# Patient Record
Sex: Male | Born: 1960 | Race: Black or African American | Hispanic: No | Marital: Married | Smoking: Never smoker
Health system: Southern US, Community
[De-identification: ages and names within clinical notes are randomized; demographics above are authoritative.]

## PROBLEM LIST (undated history)

## (undated) DIAGNOSIS — N400 Enlarged prostate without lower urinary tract symptoms: Secondary | ICD-10-CM

## (undated) DIAGNOSIS — M1990 Unspecified osteoarthritis, unspecified site: Secondary | ICD-10-CM

## (undated) DIAGNOSIS — F329 Major depressive disorder, single episode, unspecified: Secondary | ICD-10-CM

## (undated) DIAGNOSIS — F32A Depression, unspecified: Secondary | ICD-10-CM

## (undated) DIAGNOSIS — N289 Disorder of kidney and ureter, unspecified: Secondary | ICD-10-CM

## (undated) DIAGNOSIS — E785 Hyperlipidemia, unspecified: Secondary | ICD-10-CM

## (undated) HISTORY — DX: Depression, unspecified: F32.A

## (undated) HISTORY — DX: Major depressive disorder, single episode, unspecified: F32.9

## (undated) HISTORY — DX: Hyperlipidemia, unspecified: E78.5

## (undated) HISTORY — DX: Benign prostatic hyperplasia without lower urinary tract symptoms: N40.0

## (undated) HISTORY — DX: Unspecified osteoarthritis, unspecified site: M19.90

---

## 1986-10-02 HISTORY — PX: APPENDECTOMY: SHX54

## 2006-10-02 HISTORY — PX: ANKLE SURGERY: SHX546

## 2015-03-03 LAB — COLOGUARD: COLOGUARD: NEGATIVE

## 2015-10-07 ENCOUNTER — Telehealth: Payer: Self-pay

## 2015-10-07 NOTE — Telephone Encounter (Signed)
Pre Visit call completed. 

## 2015-10-08 ENCOUNTER — Encounter: Payer: Self-pay | Admitting: Physician Assistant

## 2015-10-08 ENCOUNTER — Ambulatory Visit (INDEPENDENT_AMBULATORY_CARE_PROVIDER_SITE_OTHER): Payer: BLUE CROSS/BLUE SHIELD | Admitting: Physician Assistant

## 2015-10-08 VITALS — BP 148/88 | HR 62 | Temp 98.0°F | Ht 69.0 in | Wt 225.6 lb

## 2015-10-08 DIAGNOSIS — M67911 Unspecified disorder of synovium and tendon, right shoulder: Secondary | ICD-10-CM | POA: Diagnosis not present

## 2015-10-08 DIAGNOSIS — G8929 Other chronic pain: Secondary | ICD-10-CM | POA: Diagnosis not present

## 2015-10-08 DIAGNOSIS — M25569 Pain in unspecified knee: Secondary | ICD-10-CM

## 2015-10-08 DIAGNOSIS — M25562 Pain in left knee: Secondary | ICD-10-CM

## 2015-10-08 DIAGNOSIS — M67919 Unspecified disorder of synovium and tendon, unspecified shoulder: Secondary | ICD-10-CM | POA: Insufficient documentation

## 2015-10-08 MED ORDER — DICLOFENAC SODIUM 75 MG PO TBEC: 75.0000 mg | DELAYED_RELEASE_TABLET | Freq: Two times a day (BID) | ORAL | Status: DC

## 2015-10-08 NOTE — Progress Notes (Signed)
Pre visit review using our clinic review tool, if applicable. No additional management support is needed unless otherwise documented below in the visit note. 

## 2015-10-08 NOTE — Progress Notes (Signed)
Patient presents to clinic today to establish care.  Acute Concerns: Left knee pain and swelling x 6 months or so. Denies trauma or injury. Does have history of arthritis from playing sports throughout adolesent and young adulthood. Has been followed by Orthopedics ([facility]). Has had multiple plain films of the knee and has had joint aspiration and injections in the knee without improvement in symptoms. Pain is averaging 7/10 and a 10/10 at its worst. Is worse with prolonged ambulation. Has had a few instances where the knee has buckled. Denies falls.   Patient also endorses chronic pain in R shoulder s/p multiple joint injections without relief in pain. Is followed by Ortho but unhappy with results as pain is still just as severe. Denies ever having MRI. Endorses good ROM but significant pain with ROM. Denies numbness or tingling.  Chronic Issues: BPH -- Currently on Flomax 0.4 mg nightly with good relief of symptoms. Last PSA 2 weeks ago with previous PCP. Will obtain records.  Past Medical History  Diagnosis Date  . Enlarged prostate   . Hyperlipidemia   . Osteoarthritis   . Depression     Past Surgical History  Procedure Laterality Date  . Appendectomy  1988  . Ankle surgery  2008    No current outpatient prescriptions on file prior to visit.   No current facility-administered medications on file prior to visit.    Allergies  Allergen Reactions  . Statins Other (See Comments)    Muscle pain    Family History  Problem Relation Age of Onset  . Ovarian cancer Mother   . Hypertension Mother   . Heart attack Father [id number]  . Heart attack Brother 38  . Healthy Child     x 2    Social History   Social History  . Marital Status: Married    Spouse Name: N/A  . Number of Children: N/A  . Years of Education: N/A   Occupational History  . Not on file.   Social History Main Topics  . Smoking status: Never Smoker   . Smokeless tobacco: Never Used  . Alcohol Use:  No  . Drug Use: No  . Sexual Activity:    Partners: Female   Other Topics Concern  . Not on file   Social History Narrative   Review of Systems  Constitutional: Negative for fever and malaise/fatigue.  Cardiovascular: Negative for chest pain and palpitations.  Musculoskeletal: Positive for joint pain. Negative for falls.  Neurological: Negative for dizziness, tingling, sensory change and headaches.   BP 148/88 mmHg  Pulse 62  Temp(Src) 98 F (36.7 C) (Oral)  Ht 5\' 9"  (1.753 m)  Wt 225 lb 9.6 oz (102.331 kg)  BMI 33.30 kg/m2  SpO2 98%  Physical Exam  Constitutional: He is oriented to person, place, and time and well-developed, well-nourished, and in no distress.  HENT:  Head: Normocephalic and atraumatic.  Cardiovascular: Normal rate, regular rhythm, normal heart sounds and intact distal pulses.   Pulmonary/Chest: Effort normal and breath sounds normal. No respiratory distress. He has no wheezes. He has no rales. He exhibits no tenderness.  Musculoskeletal:       Right shoulder: He exhibits pain and decreased strength. He exhibits normal range of motion.       Left knee: He exhibits abnormal meniscus. Tenderness found. Medial joint line tenderness noted.  Neurological: He is alert and oriented to person, place, and time.  Skin: Skin is warm and dry.  Vitals reviewed.  No results found for this or any previous visit (from the past 2160 hour(s)).  Assessment/Plan: Chronic knee pain Previously followed by Ortho with injections and aspirations. Giving buckling and abnl meniscus on exam, need MRI. Order placed. Will begin bracing, RICE and Voltaren BID. Follow-up 3-4 weeks.  Tendinopathy of rotator cuff Rx Diclofenac for pain. Referral to Sports Medicine for assessment and [place].

## 2015-10-08 NOTE — Patient Instructions (Signed)
Please start the Diclofenac as directed with food. Apply topical Aspercreme to the knee and shoulder. Wear a knee sleeve daily and elevate leg while resting. Apply ice.   You will be contacted to schedule MRI of the knee and for assessment of your shoulder with Sports Medicine.  Follow-up with me in 1 month.

## 2015-10-08 NOTE — Assessment & Plan Note (Signed)
Rx Diclofenac for pain. Referral to Sports Medicine for assessment and [place].

## 2015-10-08 NOTE — Assessment & Plan Note (Signed)
Previously followed by Ortho with injections and aspirations. Giving buckling and abnl meniscus on exam, need MRI. Order placed. Will begin bracing, RICE and Voltaren BID. Follow-up 3-4 weeks.

## 2015-10-13 ENCOUNTER — Ambulatory Visit: Payer: BLUE CROSS/BLUE SHIELD | Admitting: Family Medicine

## 2015-10-16 ENCOUNTER — Ambulatory Visit (HOSPITAL_BASED_OUTPATIENT_CLINIC_OR_DEPARTMENT_OTHER)
Admission: RE | Admit: 2015-10-16 | Discharge: 2015-10-16 | Disposition: A | Discharge status: Home / Self Care | Payer: BLUE CROSS/BLUE SHIELD | Source: Ambulatory Visit | Attending: Physician Assistant | Admitting: Physician Assistant

## 2015-10-16 DIAGNOSIS — M25562 Pain in left knee: Secondary | ICD-10-CM | POA: Diagnosis present

## 2015-10-16 DIAGNOSIS — R609 Edema, unspecified: Secondary | ICD-10-CM | POA: Insufficient documentation

## 2015-10-16 DIAGNOSIS — G8929 Other chronic pain: Secondary | ICD-10-CM

## 2015-10-16 DIAGNOSIS — X58XXXA Exposure to other specified factors, initial encounter: Secondary | ICD-10-CM | POA: Insufficient documentation

## 2015-10-16 DIAGNOSIS — S83242A Other tear of medial meniscus, current injury, left knee, initial encounter: Secondary | ICD-10-CM | POA: Diagnosis not present

## 2015-10-18 ENCOUNTER — Telehealth: Payer: Self-pay | Admitting: *Deleted

## 2015-10-18 DIAGNOSIS — S83207A Unspecified tear of unspecified meniscus, current injury, left knee, initial encounter: Secondary | ICD-10-CM

## 2015-10-18 NOTE — Telephone Encounter (Signed)
Referral placed. He will be contacted to schedule. Would he be willing to let me write some work restrictions for him to help cut back on walking and heavy lifting?

## 2015-10-18 NOTE — Telephone Encounter (Signed)
Spoke with the pt and informed him of recent MRI results and note.  Pt verbalized understanding.  Pt stated that he would like to be set up with another specialist.  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

## 2015-10-18 NOTE — Telephone Encounter (Signed)
-----   Message from Waldon MerlWilliam C Martin, PA-C sent at 10/17/2015  5:31 PM EST ----- MRI reveals meniscal tear -- We need to get him back in with Orthopedics as this needs surgical correction. This is why his symptoms have been so chronic. Again I do not understand why his previous Orthopedist had not ordered MRI to further assess. He can follow-up with them or let me set him up with a new specialist. Elevate leg while resting. Continue knee bracing and pain medication as directed. What are his job responsibilities -- we need to limit the amount of walking and heavy lifting he does.

## 2015-10-19 NOTE — Telephone Encounter (Signed)
Called and [facility] @ 11:04am @ [phone]) asking the pt to RTC regarding note below.//AB/CMA

## 2015-10-20 ENCOUNTER — Encounter: Payer: Self-pay | Admitting: Physician Assistant

## 2015-10-20 NOTE — Telephone Encounter (Signed)
Spoke with the pt and informed him of the note below.  Pt verbalized understanding.  Pt agreed to the letter for work restrictions. Informed Selena Batten that the pt agreed to the letter for work,so a letter was written and given to the pt.//AB/CMA

## 2015-11-08 ENCOUNTER — Ambulatory Visit: Payer: BLUE CROSS/BLUE SHIELD | Admitting: Physician Assistant

## 2016-01-25 ENCOUNTER — Ambulatory Visit (INDEPENDENT_AMBULATORY_CARE_PROVIDER_SITE_OTHER): Payer: BLUE CROSS/BLUE SHIELD | Admitting: Physician Assistant

## 2016-01-25 ENCOUNTER — Encounter: Payer: Self-pay | Admitting: Physician Assistant

## 2016-01-25 VITALS — BP 138/80 | HR 69 | Temp 97.5°F | Ht 69.0 in | Wt 227.6 lb

## 2016-01-25 DIAGNOSIS — Z136 Encounter for screening for cardiovascular disorders: Secondary | ICD-10-CM | POA: Diagnosis not present

## 2016-01-25 DIAGNOSIS — Z125 Encounter for screening for malignant neoplasm of prostate: Secondary | ICD-10-CM

## 2016-01-25 DIAGNOSIS — Z0000 Encounter for general adult medical examination without abnormal findings: Secondary | ICD-10-CM | POA: Diagnosis not present

## 2016-01-25 DIAGNOSIS — Z8249 Family history of ischemic heart disease and other diseases of the circulatory system: Secondary | ICD-10-CM

## 2016-01-25 DIAGNOSIS — G8929 Other chronic pain: Secondary | ICD-10-CM

## 2016-01-25 DIAGNOSIS — Z0001 Encounter for general adult medical examination with abnormal findings: Secondary | ICD-10-CM | POA: Diagnosis not present

## 2016-01-25 DIAGNOSIS — M2560 Stiffness of unspecified joint, not elsewhere classified: Secondary | ICD-10-CM

## 2016-01-25 DIAGNOSIS — M25562 Pain in left knee: Secondary | ICD-10-CM

## 2016-01-25 LAB — CBC
HCT: 36.4 % — ABNORMAL LOW (ref 39.0–52.0)
HEMOGLOBIN: 11.8 g/dL — AB (ref 13.0–17.0)
MCHC: 32.5 g/dL (ref 30.0–36.0)
MCV: 82.2 fl (ref 78.0–100.0)
PLATELETS: 219 10*3/uL (ref 150.0–400.0)
RBC: 4.43 Mil/uL (ref 4.22–5.81)
RDW: 15.5 % (ref 11.5–15.5)
WBC: 4.5 10*3/uL (ref 4.0–10.5)

## 2016-01-25 LAB — HEMOGLOBIN A1C: Hgb A1c MFr Bld: 6.1 % (ref 4.6–6.5)

## 2016-01-25 LAB — COMPREHENSIVE METABOLIC PANEL
ALK PHOS: 73 U/L (ref 39–117)
ALT: 51 U/L (ref 0–53)
AST: 25 U/L (ref 0–37)
Albumin: 4.1 g/dL (ref 3.5–5.2)
BILIRUBIN TOTAL: 0.5 mg/dL (ref 0.2–1.2)
BUN: 14 mg/dL (ref 6–23)
CALCIUM: 9.2 mg/dL (ref 8.4–10.5)
CO2: 29 meq/L (ref 19–32)
Chloride: 106 mEq/L (ref 96–112)
Creatinine, Ser: 0.95 mg/dL (ref 0.40–1.50)
GFR: 105.6 mL/min (ref >60.00)
Glucose, Bld: 97 mg/dL (ref 70–99)
POTASSIUM: 4.6 meq/L (ref 3.5–5.1)
Sodium: 140 mEq/L (ref 135–145)
Total Protein: 6.8 g/dL (ref 6.0–8.3)

## 2016-01-25 LAB — LIPID PANEL
CHOLESTEROL: 231 mg/dL — AB (ref 0–200)
HDL: 41.5 mg/dL (ref >39.00)
LDL CALC: 173 mg/dL — AB (ref 0–99)
NonHDL: 189.95
TRIGLYCERIDES: 87 mg/dL (ref 0.0–149.0)
Total CHOL/HDL Ratio: 6
VLDL: 17.4 mg/dL (ref 0.0–40.0)

## 2016-01-25 LAB — PSA: PSA: 0.5 ng/mL (ref 0.10–4.00)

## 2016-01-25 LAB — RHEUMATOID FACTOR: Rhuematoid fact SerPl-aCnc: <10 IU/mL (ref <=14)

## 2016-01-25 LAB — SEDIMENTATION RATE: SED RATE: 13 mm/h (ref 0–22)

## 2016-01-25 LAB — TSH: TSH: 0.78 u[IU]/mL (ref 0.35–4.50)

## 2016-01-25 MED ORDER — DICLOFENAC SODIUM 75 MG PO TBEC: 75.0000 mg | DELAYED_RELEASE_TABLET | Freq: Two times a day (BID) | ORAL | Status: DC

## 2016-01-25 NOTE — Progress Notes (Signed)
Pre visit review using our clinic review tool, if applicable. No additional management support is needed unless otherwise documented below in the visit note. 

## 2016-01-25 NOTE — Patient Instructions (Signed)
Please go to the lab for blood work.   Our office will call you with your results unless you have chosen to receive results via MyChart.  If your blood work is normal we will follow-up each year for physicals and as scheduled for chronic medical problems.  If anything is abnormal we will treat accordingly and get you in for a follow-up.  Please take the Voltaren as directed with food. If your labs show signs of rheumatoid arthritis, I will be setting you up with a Rheumatologist for management.  Follow-up will be based on results.  Preventive Care for Adults, Male A healthy lifestyle and preventive care can promote health and wellness. Preventive health guidelines for men include the following key practices:  A routine yearly physical is a good way to check with your health care provider about your health and preventative screening. It is a chance to share any concerns and updates on your health and to receive a thorough exam.  Visit your dentist for a routine exam and preventative care every 6 months. Brush your teeth twice a day and floss once a day. Good oral hygiene prevents tooth decay and gum disease.  The frequency of eye exams is based on your age, health, family medical history, use of contact lenses, and other factors. Follow your health care provider's recommendations for frequency of eye exams.  Eat a healthy diet. Foods such as vegetables, fruits, whole grains, low-fat dairy products, and lean protein foods contain the nutrients you need without too many calories. Decrease your intake of foods high in solid fats, added sugars, and salt. Eat the right amount of calories for you.Get information about a proper diet from your health care provider, if necessary.  Regular physical exercise is one of the most important things you can do for your health. Most adults should get at least 150 minutes of moderate-intensity exercise (any activity that increases your heart rate and causes you  to sweat) each week. In addition, most adults need muscle-strengthening exercises on 2 or more days a week.  Maintain a healthy weight. The body mass index (BMI) is a screening tool to identify possible weight problems. It provides an estimate of body fat based on height and weight. Your health care provider can find your BMI and can help you achieve or maintain a healthy weight.For adults 20 years and older:  A BMI below 18.5 is considered underweight.  A BMI of 18.5 to 24.9 is normal.  A BMI of 25 to 29.9 is considered overweight.  A BMI of 30 and above is considered obese.  Maintain normal blood lipids and cholesterol levels by exercising and minimizing your intake of saturated fat. Eat a balanced diet with plenty of fruit and vegetables. Blood tests for lipids and cholesterol should begin at age 92 and be repeated every 5 years. If your lipid or cholesterol levels are high, you are over 50, or you are at high risk for heart disease, you may need your cholesterol levels checked more frequently.Ongoing high lipid and cholesterol levels should be treated with medicines if diet and exercise are not working.  If you smoke, find out from your health care provider how to quit. If you do not use tobacco, do not start.  Lung cancer screening is recommended for adults aged 76-80 years who are at high risk for developing lung cancer because of a history of smoking. A yearly low-dose CT scan of the lungs is recommended for people who have at least a  30-pack-year history of smoking and are a current smoker or have quit within the past 15 years. A pack year of smoking is smoking an average of 1 pack of cigarettes a day for 1 year (for example: 1 pack a day for 30 years or 2 packs a day for 15 years). Yearly screening should continue until the smoker has stopped smoking for at least 15 years. Yearly screening should be stopped for people who develop a health problem that would prevent them from having lung  cancer treatment.  If you choose to drink alcohol, do not have more than 2 drinks per day. One drink is considered to be 12 ounces (355 mL) of beer, 5 ounces (148 mL) of wine, or 1.5 ounces (44 mL) of liquor.  Avoid use of street drugs. Do not share needles with anyone. Ask for help if you need support or instructions about stopping the use of drugs.  High blood pressure causes heart disease and increases the risk of stroke. Your blood pressure should be checked at least every 1-2 years. Ongoing high blood pressure should be treated with medicines, if weight loss and exercise are not effective.  If you are 73-43 years old, ask your health care provider if you should take aspirin to prevent heart disease.  Diabetes screening is done by taking a blood sample to check your blood glucose level after you have not eaten for a certain period of time (fasting). If you are not overweight and you do not have risk factors for diabetes, you should be screened once every 3 years starting at age 20. If you are overweight or obese and you are 28-63 years of age, you should be screened for diabetes every year as part of your cardiovascular risk assessment.  Colorectal cancer can be detected and often prevented. Most routine colorectal cancer screening begins at the age of 76 and continues through age 53. However, your health care provider may recommend screening at an earlier age if you have risk factors for colon cancer. On a yearly basis, your health care provider may provide home test kits to check for hidden blood in the stool. Use of a small camera at the end of a tube to directly examine the colon (sigmoidoscopy or colonoscopy) can detect the earliest forms of colorectal cancer. Talk to your health care provider about this at age 17, when routine screening begins. Direct exam of the colon should be repeated every 5-10 years through age 24, unless early forms of precancerous polyps or small growths are  found.  People who are at an increased risk for hepatitis B should be screened for this virus. You are considered at high risk for hepatitis B if:  You were born in a country where hepatitis B occurs often. Talk with your health care provider about which countries are considered high risk.  Your parents were born in a high-risk country and you have not received a shot to protect against hepatitis B (hepatitis B vaccine).  You have HIV or AIDS.  You use needles to inject street drugs.  You live with, or have sex with, someone who has hepatitis B.  You are a man who has sex with other men (MSM).  You get hemodialysis treatment.  You take certain medicines for conditions such as cancer, organ transplantation, and autoimmune conditions.  Hepatitis C blood testing is recommended for all people born from [birth year] through 1965 and any individual with known risks for hepatitis C.  Practice safe sex. Use  condoms and avoid high-risk sexual practices to reduce the spread of sexually transmitted infections (STIs). STIs include gonorrhea, chlamydia, syphilis, trichomonas, herpes, HPV, and human immunodeficiency virus (HIV). Herpes, HIV, and HPV are viral illnesses that have no cure. They can result in disability, cancer, and death.  If you are a man who has sex with other men, you should be screened at least once per year for:  HIV.  Urethral, rectal, and pharyngeal infection of gonorrhea, chlamydia, or both.  If you are at risk of being infected with HIV, it is recommended that you take a prescription medicine daily to prevent HIV infection. This is called preexposure prophylaxis (PrEP). You are considered at risk if:  You are a man who has sex with other men (MSM) and have other risk factors.  You are a heterosexual man, are sexually active, and are at increased risk for HIV infection.  You take drugs by injection.  You are sexually active with a partner who has HIV.  Talk with your health  care provider about whether you are at high risk of being infected with HIV. If you choose to begin PrEP, you should first be tested for HIV. You should then be tested every 3 months for as long as you are taking PrEP.  A one-time screening for abdominal aortic aneurysm (AAA) and surgical repair of large AAAs by ultrasound are recommended for men ages 38 to 34 years who are current or former smokers.  Healthy men should no longer receive prostate-specific antigen (PSA) blood tests as part of routine cancer screening. Talk with your health care provider about prostate cancer screening.  Testicular cancer screening is not recommended for adult males who have no symptoms. Screening includes self-exam, a health care provider exam, and other screening tests. Consult with your health care provider about any symptoms you have or any concerns you have about testicular cancer.  Use sunscreen. Apply sunscreen liberally and repeatedly throughout the day. You should seek shade when your shadow is shorter than you. Protect yourself by wearing long sleeves, pants, a wide-brimmed hat, and sunglasses year round, whenever you are outdoors.  Once a month, do a whole-body skin exam, using a mirror to look at the skin on your back. Tell your health care provider about new moles, moles that have irregular borders, moles that are larger than a pencil eraser, or moles that have changed in shape or color.  Stay current with required vaccines (immunizations).  Influenza vaccine. All adults should be immunized every year.  Tetanus, diphtheria, and acellular pertussis (Td, Tdap) vaccine. An adult who has not previously received Tdap or who does not know his vaccine status should receive 1 dose of Tdap. This initial dose should be followed by tetanus and diphtheria toxoids (Td) booster doses every 10 years. Adults with an unknown or incomplete history of completing a 3-dose immunization series with Td-containing vaccines should  begin or complete a primary immunization series including a Tdap dose. Adults should receive a Td booster every 10 years.  Varicella vaccine. An adult without evidence of immunity to varicella should receive 2 doses or a second dose if he has previously received 1 dose.  Human papillomavirus (HPV) vaccine. Males aged 11-21 years who have not received the vaccine previously should receive the 3-dose series. Males aged 22-26 years may be immunized. Immunization is recommended through the age of 75 years for any male who has sex with males and did not get any or all doses earlier. Immunization is recommended  for any person with an immunocompromised condition through the age of 29 years if he did not get any or all doses earlier. During the 3-dose series, the second dose should be obtained 4-8 weeks after the first dose. The third dose should be obtained 24 weeks after the first dose and 16 weeks after the second dose.  Zoster vaccine. One dose is recommended for adults aged 53 years or older unless certain conditions are present.  Measles, mumps, and rubella (MMR) vaccine. Adults born before [birth year] generally are considered immune to measles and mumps. Adults born in [birth year] or later should have 1 or more doses of MMR vaccine unless there is a contraindication to the vaccine or there is laboratory evidence of immunity to each of the three diseases. A routine second dose of MMR vaccine should be obtained at least 28 days after the first dose for students attending postsecondary schools, health care workers, or international travelers. People who received inactivated measles vaccine or an unknown type of measles vaccine during 1963-1967 should receive 2 doses of MMR vaccine. People who received inactivated mumps vaccine or an unknown type of mumps vaccine before 1979 and are at high risk for mumps infection should consider immunization with 2 doses of MMR vaccine. Unvaccinated health care workers born before [birth year] who  lack laboratory evidence of measles, mumps, or rubella immunity or laboratory confirmation of disease should consider measles and mumps immunization with 2 doses of MMR vaccine or rubella immunization with 1 dose of MMR vaccine.  Pneumococcal 13-valent conjugate (PCV13) vaccine. When indicated, a person who is uncertain of his immunization history and has no record of immunization should receive the PCV13 vaccine. All adults 70 years of age and older should receive this vaccine. An adult aged 38 years or older who has certain medical conditions and has not been previously immunized should receive 1 dose of PCV13 vaccine. This PCV13 should be followed with a dose of pneumococcal polysaccharide (PPSV23) vaccine. Adults who are at high risk for pneumococcal disease should obtain the PPSV23 vaccine at least 8 weeks after the dose of PCV13 vaccine. Adults older than 56 years of age who have normal immune system function should obtain the PPSV23 vaccine dose at least 1 year after the dose of PCV13 vaccine.  Pneumococcal polysaccharide (PPSV23) vaccine. When PCV13 is also indicated, PCV13 should be obtained first. All adults aged 59 years and older should be immunized. An adult younger than age 76 years who has certain medical conditions should be immunized. Any person who resides in a nursing home or long-term care facility should be immunized. An adult smoker should be immunized. People with an immunocompromised condition and certain other conditions should receive both PCV13 and PPSV23 vaccines. People with human immunodeficiency virus (HIV) infection should be immunized as soon as possible after diagnosis. Immunization during chemotherapy or radiation therapy should be avoided. Routine use of PPSV23 vaccine is not recommended for American Indians, [place] Natives, or people younger than 65 years unless there are medical conditions that require PPSV23 vaccine. When indicated, people who have unknown immunization and  have no record of immunization should receive PPSV23 vaccine. One-time revaccination 5 years after the first dose of PPSV23 is recommended for people aged 19-64 years who have chronic kidney failure, nephrotic syndrome, asplenia, or immunocompromised conditions. People who received 1-2 doses of PPSV23 before age 37 years should receive another dose of PPSV23 vaccine at age 58 years or later if at least 5 years have passed since the previous  dose. Doses of PPSV23 are not needed for people immunized with PPSV23 at or after age 19 years.  Meningococcal vaccine. Adults with asplenia or persistent complement component deficiencies should receive 2 doses of quadrivalent meningococcal conjugate (MenACWY-D) vaccine. The doses should be obtained at least 2 months apart. Microbiologists working with certain meningococcal bacteria, [employer], people at risk during an outbreak, and people who travel to or live in countries with a high rate of meningitis should be immunized. A first-year college student up through age 47 years who is living in a residence hall should receive a dose if he did not receive a dose on or after his 16th birthday. Adults who have certain high-risk conditions should receive one or more doses of vaccine.  Hepatitis A vaccine. Adults who wish to be protected from this disease, have chronic liver disease, work with hepatitis A-infected animals, work in hepatitis A research labs, or travel to or work in countries with a high rate of hepatitis A should be immunized. Adults who were previously unvaccinated and who anticipate close contact with an international adoptee during the first 60 days after arrival in the [place] from a country with a high rate of hepatitis A should be immunized.  Hepatitis B vaccine. Adults should be immunized if they wish to be protected from this disease, are under age 45 years and have diabetes, have chronic liver disease, have had more than one sex partner in  the past 6 months, may be exposed to blood or other infectious body fluids, are household contacts or sex partners of hepatitis B positive people, are clients or workers in certain care facilities, or travel to or work in countries with a high rate of hepatitis B.  Haemophilus influenzae type b (Hib) vaccine. A previously unvaccinated person with asplenia or sickle cell disease or having a scheduled splenectomy should receive 1 dose of Hib vaccine. Regardless of previous immunization, a recipient of a hematopoietic stem cell transplant should receive a 3-dose series 6-12 months after his successful transplant. Hib vaccine is not recommended for adults with HIV infection. Preventive Service / Frequency Ages 11 to 13  Blood pressure check.** / Every 3-5 years.  Lipid and cholesterol check.** / Every 5 years beginning at age 30.  Hepatitis C blood test.** / For any individual with known risks for hepatitis C.  Skin self-exam. / Monthly.  Influenza vaccine. / Every year.  Tetanus, diphtheria, and acellular pertussis (Tdap, Td) vaccine.** / Consult your health care provider. 1 dose of Td every 10 years.  Varicella vaccine.** / Consult your health care provider.  HPV vaccine. / 3 doses over 6 months, if 52 or younger.  Measles, mumps, rubella (MMR) vaccine.** / You need at least 1 dose of MMR if you were born in 1957 or later. You may also need a second dose.  Pneumococcal 13-valent conjugate (PCV13) vaccine.** / Consult your health care provider.  Pneumococcal polysaccharide (PPSV23) vaccine.** / 1 to 2 doses if you smoke cigarettes or if you have certain conditions.  Meningococcal vaccine.** / 1 dose if you are age 82 to 99 years and a Market researcher living in a residence hall, or have one of several medical conditions. You may also need additional booster doses.  Hepatitis A vaccine.** / Consult your health care provider.  Hepatitis B vaccine.** / Consult your health care  provider.  Haemophilus influenzae type b (Hib) vaccine.** / Consult your health care provider. Ages 90 to 40  Blood pressure check.** /  Every year.  Lipid and cholesterol check.** / Every 5 years beginning at age 15.  Lung cancer screening. / Every year if you are aged 75-80 years and have a 30-pack-year history of smoking and currently smoke or have quit within the past 15 years. Yearly screening is stopped once you have quit smoking for at least 15 years or develop a health problem that would prevent you from having lung cancer treatment.  Fecal occult blood test (FOBT) of stool. / Every year beginning at age 74 and continuing until age 27. You may not have to do this test if you get a colonoscopy every 10 years.  Flexible sigmoidoscopy** or colonoscopy.** / Every 5 years for a flexible sigmoidoscopy or every 10 years for a colonoscopy beginning at age 75 and continuing until age 1.  Hepatitis C blood test.** / For all people born from [birth year] through 1965 and any individual with known risks for hepatitis C.  Skin self-exam. / Monthly.  Influenza vaccine. / Every year.  Tetanus, diphtheria, and acellular pertussis (Tdap/Td) vaccine.** / Consult your health care provider. 1 dose of Td every 10 years.  Varicella vaccine.** / Consult your health care provider.  Zoster vaccine.** / 1 dose for adults aged 45 years or older.  Measles, mumps, rubella (MMR) vaccine.** / You need at least 1 dose of MMR if you were born in 1957 or later. You may also need a second dose.  Pneumococcal 13-valent conjugate (PCV13) vaccine.** / Consult your health care provider.  Pneumococcal polysaccharide (PPSV23) vaccine.** / 1 to 2 doses if you smoke cigarettes or if you have certain conditions.  Meningococcal vaccine.** / Consult your health care provider.  Hepatitis A vaccine.** / Consult your health care provider.  Hepatitis B vaccine.** / Consult your health care provider.  Haemophilus influenzae  type b (Hib) vaccine.** / Consult your health care provider. Ages 38 and over  Blood pressure check.** / Every year.  Lipid and cholesterol check.**/ Every 5 years beginning at age 20.  Lung cancer screening. / Every year if you are aged 77-80 years and have a 30-pack-year history of smoking and currently smoke or have quit within the past 15 years. Yearly screening is stopped once you have quit smoking for at least 15 years or develop a health problem that would prevent you from having lung cancer treatment.  Fecal occult blood test (FOBT) of stool. / Every year beginning at age 2 and continuing until age 68. You may not have to do this test if you get a colonoscopy every 10 years.  Flexible sigmoidoscopy** or colonoscopy.** / Every 5 years for a flexible sigmoidoscopy or every 10 years for a colonoscopy beginning at age 100 and continuing until age 87.  Hepatitis C blood test.** / For all people born from [birth year] through 1965 and any individual with known risks for hepatitis C.  Abdominal aortic aneurysm (AAA) screening.** / A one-time screening for ages 68 to 60 years who are current or former smokers.  Skin self-exam. / Monthly.  Influenza vaccine. / Every year.  Tetanus, diphtheria, and acellular pertussis (Tdap/Td) vaccine.** / 1 dose of Td every 10 years.  Varicella vaccine.** / Consult your health care provider.  Zoster vaccine.** / 1 dose for adults aged 56 years or older.  Pneumococcal 13-valent conjugate (PCV13) vaccine.** / 1 dose for all adults aged 8 years and older.  Pneumococcal polysaccharide (PPSV23) vaccine.** / 1 dose for all adults aged 51 years and older.  Meningococcal vaccine.** / Consult  your health care provider.  Hepatitis A vaccine.** / Consult your health care provider.  Hepatitis B vaccine.** / Consult your health care provider.  Haemophilus influenzae type b (Hib) vaccine.** / Consult your health care provider. **Family history and personal history  of risk and conditions may change your health care provider's recommendations.   This information is not intended to replace advice given to you by your health care provider. Make sure you discuss any questions you have with your health care provider.   Document Released: 11/14/2001 Document Revised: 10/09/2014 Document Reviewed: 02/13/2011 [employer]vier Interactive Patient Education [employer]. .

## 2016-01-25 NOTE — Progress Notes (Signed)
Patient presents to clinic today for annual exam.  Patient is fasting for labs.  Acute Concerns: Patient endorses stiffness and aching pain in fingers of hands bilaterally. Endorses taking > 1-2 hours to fully work out the stiffness. Denies redness or swelling of joints. Denies trauma or injury of hands.  Chronic Issues: BPH -- Is currently on Tamsulosin, prescribed by Urology. Endorses doing very well on this regimen.   Body mass index is 33.6 kg/(m^2). -- Is working on exercise and diet. Exercise is limited at present due to a meniscal tear. Patient has declined interventions recommended by Orthopedics.   Health Maintenance: Immunizations --Declines Tetanus today. Colonoscopy -- Cologuard up-to-date  Past Medical History  Diagnosis Date  . Enlarged prostate   . Hyperlipidemia   . Osteoarthritis   . Depression     Past Surgical History  Procedure Laterality Date  . Appendectomy  1988  . Ankle surgery  2008    Current Outpatient Prescriptions on File Prior to Visit  Medication Sig Dispense Refill  . Omega-3 Fatty Acids (FISH OIL) 1000 MG CAPS Take 1 capsule by mouth daily.    . tamsulosin (FLOMAX) 0.4 MG CAPS capsule Take 1 capsule by mouth daily.     No current facility-administered medications on file prior to visit.    Allergies  Allergen Reactions  . Statins Other (See Comments)    Muscle pain    Family History  Problem Relation Age of Onset  . Ovarian cancer Mother   . Hypertension Mother   . Heart attack Father 25  . Heart attack Brother 28  . Healthy Child     x 2    Social History   Social History  . Marital Status: Married    Spouse Name: N/A  . Number of Children: N/A  . Years of Education: N/A   Occupational History  . Not on file.   Social History Main Topics  . Smoking status: Never Smoker   . Smokeless tobacco: Never Used  . Alcohol Use: No  . Drug Use: No  . Sexual Activity:    Partners: Female   Other Topics Concern  . Not  on file   Social History Narrative   Review of Systems  Constitutional: Negative for fever and weight loss.  HENT: Negative for ear discharge, ear pain, hearing loss and tinnitus.   Eyes: Negative for blurred vision, double vision, photophobia and pain.  Respiratory: Negative for cough and shortness of breath.   Cardiovascular: Negative for chest pain and palpitations.  Gastrointestinal: Negative for heartburn, nausea, vomiting, abdominal pain, diarrhea, constipation, blood in stool and melena.  Genitourinary: Negative for dysuria, urgency, frequency, hematuria and flank pain.  Musculoskeletal: Positive for joint pain. Negative for falls.  Neurological: Negative for dizziness, loss of consciousness and headaches.  Endo/Heme/Allergies: Negative for environmental allergies.  Psychiatric/Behavioral: Negative for depression, suicidal ideas, hallucinations and substance abuse. The patient is not nervous/anxious and does not have insomnia.    BP 138/80 mmHg  Pulse 69  Temp(Src) 97.5 F (36.4 C) (Oral)  Ht '5\' 9"'$  (1.753 m)  Wt 227 lb 9.6 oz (103.239 kg)  BMI 33.60 kg/m2  SpO2 98%  Physical Exam  Constitutional: He is oriented to person, place, and time and well-developed, well-nourished, and in no distress.  HENT:  Head: Normocephalic and atraumatic.  Right Ear: External ear normal.  Left Ear: External ear normal.  Nose: Nose normal.  Mouth/Throat: Oropharynx is clear and moist. No oropharyngeal exudate.  Eyes: Conjunctivae  and EOM are normal. Pupils are equal, round, and reactive to light.  Neck: Neck supple. No thyromegaly present.  Cardiovascular: Normal rate, regular rhythm, normal heart sounds and intact distal pulses.   Pulmonary/Chest: Effort normal and breath sounds normal. No respiratory distress. He has no wheezes. He has no rales. He exhibits no tenderness.  Abdominal: Soft. Bowel sounds are normal. He exhibits no distension and no mass. There is no tenderness. There is no  rebound and no guarding.  Genitourinary: Testes/scrotum normal.  Musculoskeletal:       Right hand: He exhibits normal range of motion and no swelling. Normal sensation noted. Normal strength noted.       Left hand: He exhibits normal range of motion and no swelling. Normal sensation noted. Normal strength noted.  Multiple bouchard's nodes noted on digits bilaterally  Lymphadenopathy:    He has no cervical adenopathy.  Neurological: He is alert and oriented to person, place, and time.  Skin: Skin is warm and dry. No rash noted.  Psychiatric: Affect normal.  Vitals reviewed.  Assessment/Plan: Visit for preventive health examination Depression screen negative. Health Maintenance reviewed -- Declines tetanus. Will check on Hep C screen. Preventive schedule discussed and handout given in AVS. Will obtain fasting labs today.   Family history of early CAD EKG reveals NSR. Giving significant family history of early MI, will order stress test. 81 mg ASA daily. Will check lipids today  Prostate cancer screening The natural history of prostate cancer and ongoing controversy regarding screening and potential treatment outcomes of prostate cancer has been discussed with the patient. The meaning of a false positive PSA and a false negative PSA has been discussed. He indicates understanding of the limitations of this screening test and wishes to proceed with screening PSA testing.   Joint stiffness OA versus RA. There are nodes typically found in OA present but the stiffness is significant which could point to RA. Will check ESR and RF today. Rx Voltaren. Supportive measures reviewed. May need Rheumatology referral. Will wait on lab results.

## 2016-01-27 DIAGNOSIS — Z0000 Encounter for general adult medical examination without abnormal findings: Secondary | ICD-10-CM | POA: Insufficient documentation

## 2016-01-27 DIAGNOSIS — Z8249 Family history of ischemic heart disease and other diseases of the circulatory system: Secondary | ICD-10-CM | POA: Insufficient documentation

## 2016-01-27 DIAGNOSIS — M2560 Stiffness of unspecified joint, not elsewhere classified: Secondary | ICD-10-CM | POA: Insufficient documentation

## 2016-01-27 DIAGNOSIS — Z125 Encounter for screening for malignant neoplasm of prostate: Secondary | ICD-10-CM | POA: Insufficient documentation

## 2016-01-27 NOTE — Assessment & Plan Note (Signed)
EKG reveals NSR. Giving significant family history of early MI, will order stress test. 81 mg ASA daily. Will check lipids today

## 2016-01-27 NOTE — Assessment & Plan Note (Signed)
OA versus RA. There are nodes typically found in OA present but the stiffness is significant which could point to RA. Will check ESR and RF today. Rx Voltaren. Supportive measures reviewed. May need Rheumatology referral. Will wait on lab results.

## 2016-01-27 NOTE — Assessment & Plan Note (Signed)
The natural history of prostate cancer and ongoing controversy regarding screening and potential treatment outcomes of prostate cancer has been discussed with the patient. The meaning of a false positive PSA and a false negative PSA has been discussed. He indicates understanding of the limitations of this screening test and wishes  to proceed with screening PSA testing.  

## 2016-01-27 NOTE — Assessment & Plan Note (Signed)
Depression screen negative. Health Maintenance reviewed -- Declines tetanus. Will check on Hep C screen. Preventive schedule discussed and handout given in AVS. Will obtain fasting labs today.

## 2016-01-28 ENCOUNTER — Telehealth: Payer: Self-pay | Admitting: Physician Assistant

## 2016-01-28 DIAGNOSIS — M79643 Pain in unspecified hand: Secondary | ICD-10-CM

## 2016-01-28 NOTE — Telephone Encounter (Signed)
Pt is returning CMA's call for lab result.    CB: [phone]

## 2016-01-28 NOTE — Telephone Encounter (Signed)
-----   Message from Waldon MerlWilliam C Martin, PA-C sent at 01/28/2016  8:21 AM EDT ----- Labs look good overall.  (1) Rheumatoid factor and sed rate (marker of inflammation) are negative. However giving his symptoms and significant stiffness, would recommend assessment by specialist. Let me know if he is willing to see Hand specialist.  (2) Very mild anemia noted on blood count. Make sure he is hydrating well and eating a balance diet. Assess for any history of anemia.  FU 1 month to reassess.  (3) Metabolic Panel, Prostate testing and Thyroid function are all good. (4) A1C is at 6.1, placing him in the pre-diabetic range. Limit carb heavy meals. Increase exercise.  (5) Cholesterol is high with Total at 231 and LDL in 170s. He has a listed intolerance to statins (muscle aches). Can we verify which ones he has tried. A lot of times people will be able to tolerate milder statins like Pravastatin or Livalo. I would like to try one of these if he has not before.

## 2016-01-28 NOTE — Telephone Encounter (Signed)
Patient informed, understood & agreed/SLS 1) Referral to hand specialist placed 2) will schedule appt when called back on Monday to inform him of chosen cholesterol Rx 3) all good 4) Understood and agreed 5) will call back Monday with provider chosen medication, pt cannot remember what has been tried in past Please Advise on medication/SLS 04/28

## 2016-02-01 MED ORDER — PITAVASTATIN CALCIUM 4 MG PO TABS: 4.0000 mg | ORAL_TABLET | Freq: Every day | ORAL | Status: AC

## 2016-02-01 NOTE — Telephone Encounter (Signed)
Rx request to pharmacy; pt informed, understood & agreed/SLS

## 2016-02-02 ENCOUNTER — Telehealth: Payer: Self-pay | Admitting: *Deleted

## 2016-02-02 NOTE — Telephone Encounter (Signed)
PA initiated and Approved via Cover My Meds for Livalo; pharmacy informed/SLS 05/03

## 2016-02-03 ENCOUNTER — Encounter (INDEPENDENT_AMBULATORY_CARE_PROVIDER_SITE_OTHER): Payer: Self-pay

## 2016-02-03 ENCOUNTER — Ambulatory Visit (HOSPITAL_BASED_OUTPATIENT_CLINIC_OR_DEPARTMENT_OTHER)
Admission: RE | Admit: 2016-02-03 | Discharge: 2016-02-03 | Disposition: A | Discharge status: Home / Self Care | Payer: BLUE CROSS/BLUE SHIELD | Source: Ambulatory Visit | Attending: Family Medicine | Admitting: Family Medicine

## 2016-02-03 ENCOUNTER — Encounter: Payer: Self-pay | Admitting: Family Medicine

## 2016-02-03 ENCOUNTER — Ambulatory Visit (INDEPENDENT_AMBULATORY_CARE_PROVIDER_SITE_OTHER): Payer: BLUE CROSS/BLUE SHIELD | Admitting: Family Medicine

## 2016-02-03 VITALS — BP 122/75 | HR 67 | Ht 69.0 in | Wt 225.0 lb

## 2016-02-03 DIAGNOSIS — M79642 Pain in left hand: Secondary | ICD-10-CM

## 2016-02-03 DIAGNOSIS — M79641 Pain in right hand: Secondary | ICD-10-CM | POA: Diagnosis not present

## 2016-02-03 DIAGNOSIS — M25842 Other specified joint disorders, left hand: Secondary | ICD-10-CM | POA: Insufficient documentation

## 2016-02-03 DIAGNOSIS — M7989 Other specified soft tissue disorders: Secondary | ICD-10-CM | POA: Diagnosis not present

## 2016-02-03 NOTE — Patient Instructions (Addendum)
Your pain is due to osteoarthritis. These are the 4 different classes of medicine you can take for this: Tylenol 500mg  1-2 tabs three times a day for pain. Voltaren twice a day with food for pain and inflammation. Glucosamine sulfate 750mg  twice a day is a supplement that may help. Capsaicin, aspercreme, or biofreeze topically up to four times a day may also help with pain. Cortisone injections are an option if a specific joint becomes severely inflamed. It's important that you continue to stay active. Heat or soaking in warm water for 15 minutes at a time 3-4 times a day. Consider occupational therapy to strengthen muscles around the joint that hurts to take pressure off of the joint itself.

## 2016-02-04 DIAGNOSIS — M79641 Pain in right hand: Secondary | ICD-10-CM | POA: Insufficient documentation

## 2016-02-04 DIAGNOSIS — M79642 Pain in left hand: Principal | ICD-10-CM

## 2016-02-04 NOTE — Progress Notes (Signed)
PCP and consultation requested by: Nathaniel Vasquez, William Cody, PA-C  Subjective:   HPI: Patient is a 55 y.o. male here for bilateral hand pain.  Patient reports several years of bilateral hand pain primarily in all digits. Associated swelling, stiffness. Can be stiff over an hour in the morning. 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 Pain level gets up to 8/10, sharp. No numbness, fever. Has some swelling left ankle but has had surgery here previously. Started voltaren with mild benefit.  Past Medical History  Diagnosis Date  . Enlarged prostate   . Hyperlipidemia   . Osteoarthritis   . Depression     Current Outpatient Prescriptions on File Prior to Visit  Medication Sig Dispense Refill  . diclofenac (VOLTAREN) 75 MG EC tablet Take 1 tablet (75 mg total) by mouth 2 (two) times daily. 60 tablet 0  . Omega-3 Fatty Acids (FISH OIL) 1000 MG CAPS Take 1 capsule by mouth daily.    . Pitavastatin Calcium 4 MG TABS Take 1 tablet (4 mg total) by mouth daily. 30 tablet 3  . tamsulosin (FLOMAX) 0.4 MG CAPS capsule Take 1 capsule by mouth daily.     No current facility-administered medications on file prior to visit.    Past Surgical History  Procedure Laterality Date  . Appendectomy  1988  . Ankle surgery  2008    Allergies  Allergen Reactions  . Statins Other (See Comments)    Muscle pain    Social History   Social History  . Marital Status: Married    Spouse Name: N/A  . Number of Children: N/A  . Years of Education: N/A   Occupational History  . Not on file.   Social History Main Topics  . Smoking status: Never Smoker   . Smokeless tobacco: Never Used  . Alcohol Use: No  . Drug Use: No  . Sexual Activity:    Partners: Female   Other Topics Concern  . Not on file   Social History Narrative    Family History  Problem Relation Age of Onset  . Ovarian cancer Mother   . Hypertension Mother   . Heart attack Father [id number]  . Heart attack Brother 38   . Healthy Child     x 2    BP 122/75 mmHg  Pulse 67  Ht 5\' 9"  (1.753 m)  Wt 225 lb (102.059 kg)  BMI 33.21 kg/m2  Review of Systems: See HPI above.    Objective:  Physical Exam:  Gen: NAD, comfortable in exam room  Bilateral hands: Prominent heberdens and bouchards nodes bilaterally.  No warmth, redness of joints.  No deformity at MCPs or wrist joints. TTP essentially all PIP and DIPs including IP joints of thumbs but all mild. Full extension at all joints, mild limitation of flexion at DIP, PIP, thumb IP joints. Collateral ligaments intact and can resist flexion and extension at all joints without pain. Negative tinels and phalens. NVI distally.    Assessment & Plan:  1. Bilateral hand pain - independently reviewed today's radiographs - no erosive changes and no involvement of MCP or wrist joints to suggest rheumatoid arthritis.  He does have a strong family history of OA which is the cause of his pain.  We reviewed tylenol, voltaren, topical medications, glucosamine.  Can consider an intraarticular injection for any particular joint that flares up specifically.  Suspect heat and warm water soaks will help him.  Can consider occupational therapy which can provide mild benefit.  Bloodwork from PCP along with exam, radiographs do not suggest a rheumatologic cause of his pain.

## 2016-02-04 NOTE — Assessment & Plan Note (Signed)
independently reviewed today's radiographs - no erosive changes and no involvement of MCP or wrist joints to suggest rheumatoid arthritis.  He does have a strong family history of OA which is the cause of his pain.  We reviewed tylenol, voltaren, topical medications, glucosamine.  Can consider an intraarticular injection for any particular joint that flares up specifically.  Suspect heat and warm water soaks will help him.  Can consider occupational therapy which can provide mild benefit.  Bloodwork from PCP along with exam, radiographs do not suggest a rheumatologic cause of his pain.

## 2016-04-19 ENCOUNTER — Other Ambulatory Visit: Payer: Self-pay | Admitting: Physician Assistant

## 2016-04-19 NOTE — Telephone Encounter (Signed)
Rx request to pharmacy/SLS  

## 2018-03-03 IMAGING — DX DG HAND COMPLETE 3+V*L*
3 series · 3 of 3 positions shown · non-contrast
Comparison: None.

CLINICAL DATA: Chronic pain

EXAM:
LEFT HAND - COMPLETE 3+ VIEW

[hand pa]
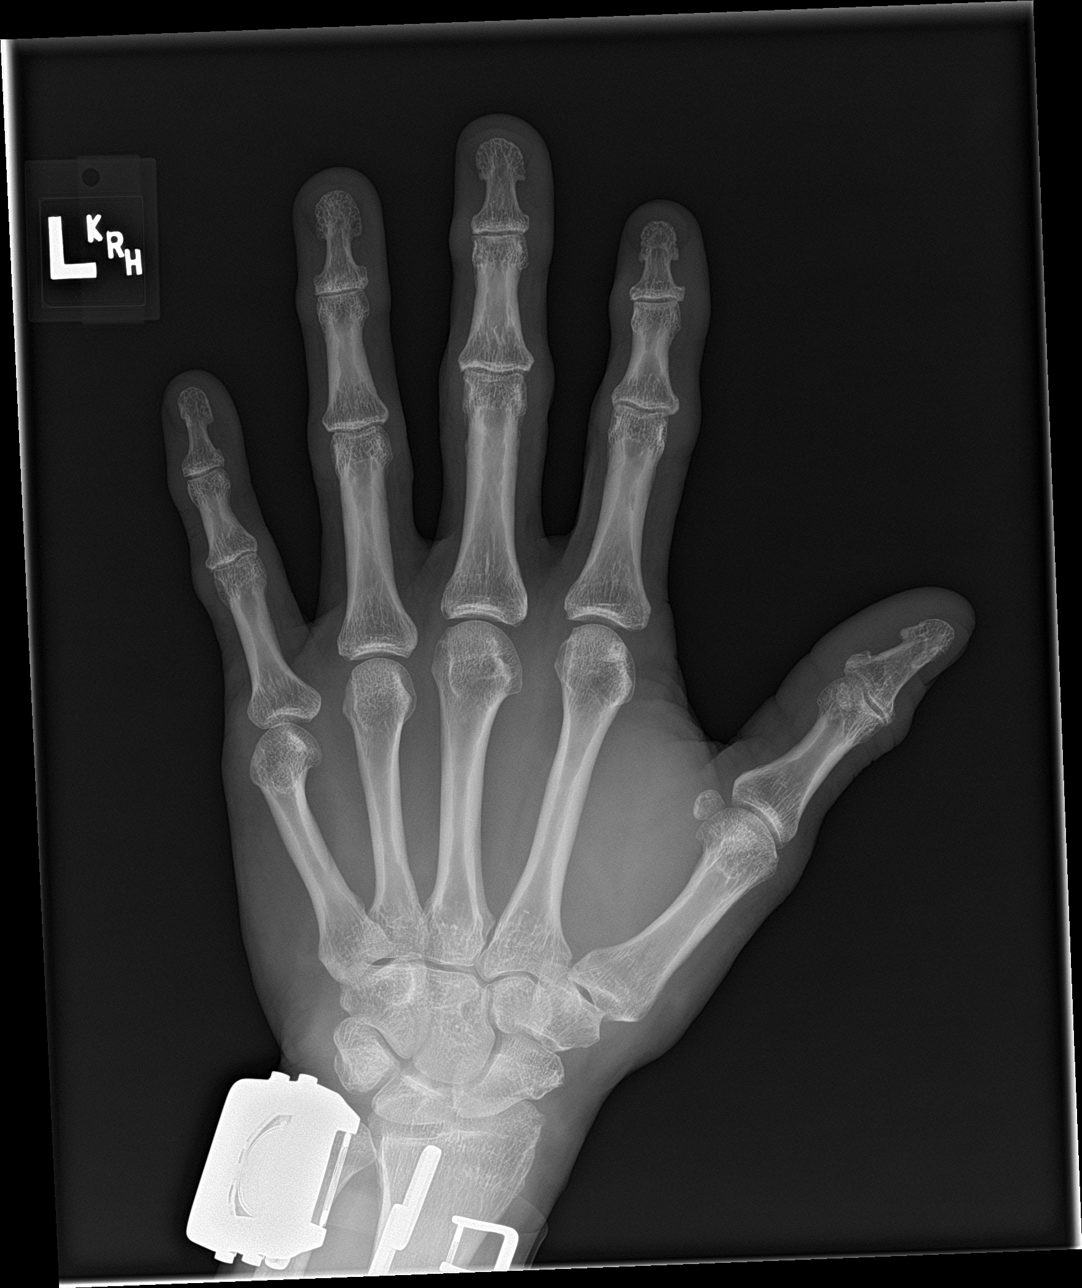

[hand obl]
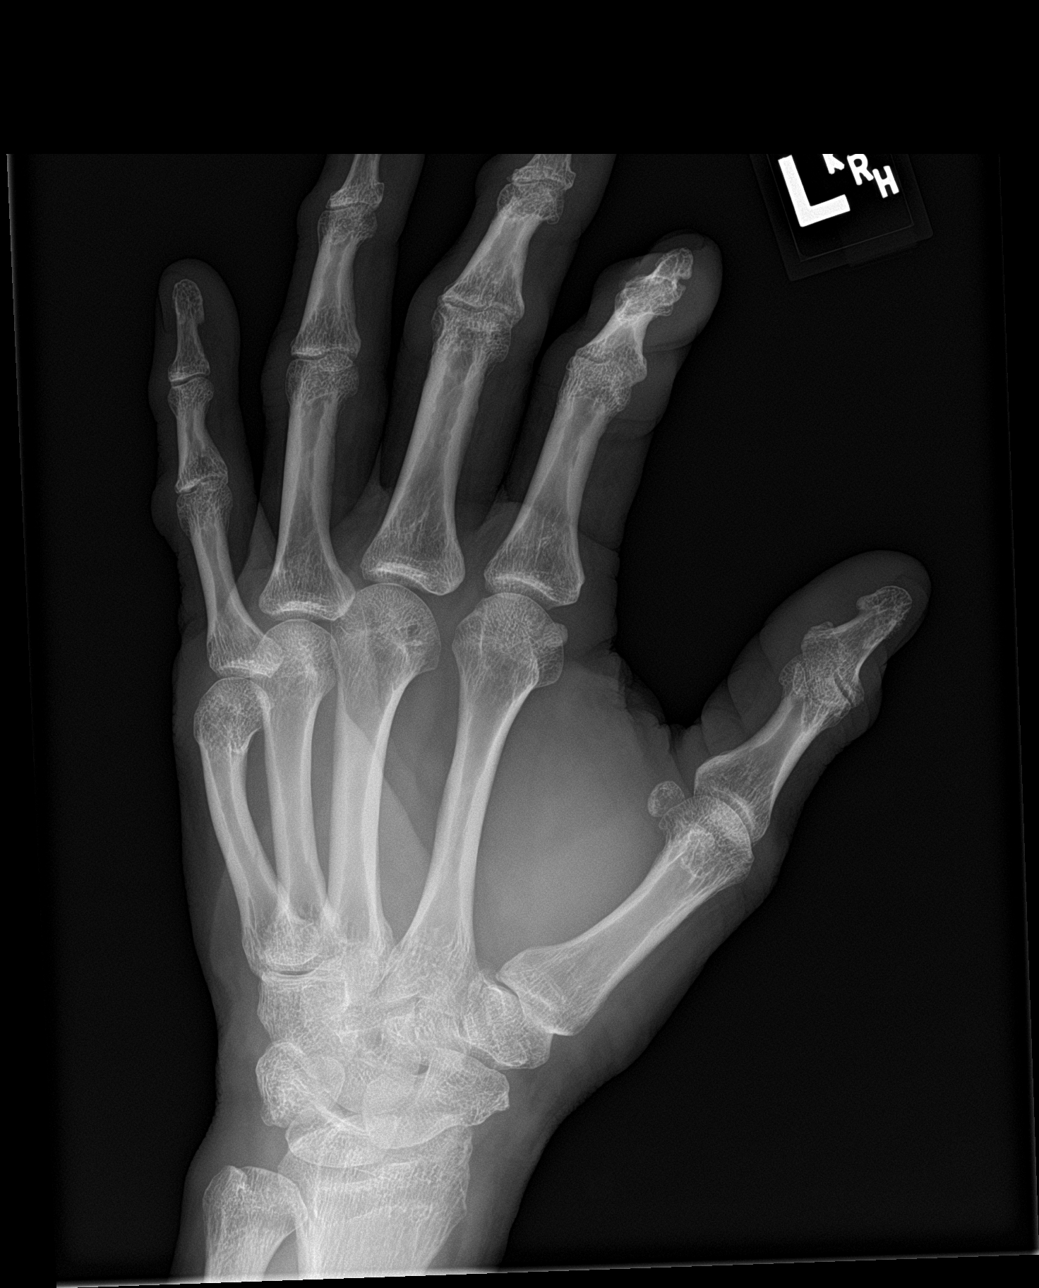

[hand lat]
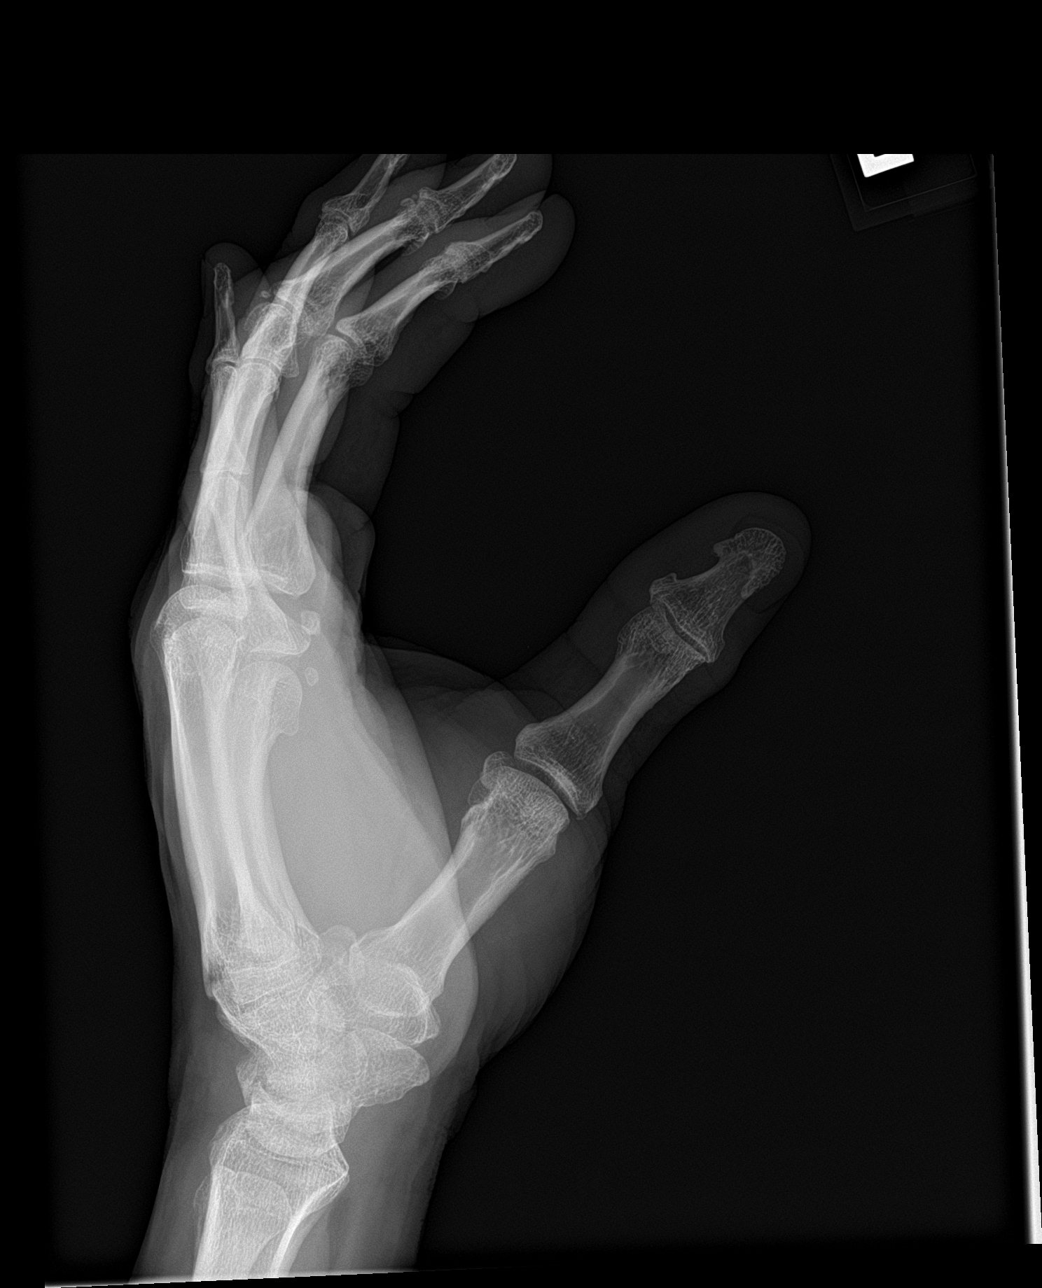

[3 of 3 positions shown; findings below may reference images not displayed]

FINDINGS: Frontal, oblique, and lateral views were obtained. There is
remodeling in the distal fifth metacarpal consistent with prior
fracture in this area. No acute fracture or dislocation. The bony
mineralization appears within normal limits. There is mild soft
tissue swelling involving all PIP and DIP joints. There is
calcification in the dorsal aspect of the third DIP joint as well as
in the dorsal third PIP joint. There are no erosive changes or
periostitis. There is no significant joint space narrowing.
IMPRESSION: Soft tissue swelling of multiple distal joints. Intra-articular
calcification along the dorsal aspect of the third PIP and DIP
joints. These findings are consistent with arthropathy, somewhat
nonspecific. Suspect osteoarthritis as statistically most likely
etiology. There is no erosive change or periostitis.

There is evidence of an old healed fracture of the distal fifth
metacarpal with remodeling. No acute fracture or dislocation. Normal
bony mineralization.

## 2019-02-21 ENCOUNTER — Other Ambulatory Visit: Payer: Self-pay

## 2019-02-21 ENCOUNTER — Emergency Department (HOSPITAL_BASED_OUTPATIENT_CLINIC_OR_DEPARTMENT_OTHER)
Admission: EM | Admit: 2019-02-21 | Discharge: 2019-02-21 | Disposition: A | Discharge status: Home / Self Care | Payer: PRIVATE HEALTH INSURANCE | Attending: Emergency Medicine | Admitting: Emergency Medicine

## 2019-02-21 ENCOUNTER — Emergency Department (HOSPITAL_BASED_OUTPATIENT_CLINIC_OR_DEPARTMENT_OTHER): Payer: PRIVATE HEALTH INSURANCE

## 2019-02-21 ENCOUNTER — Encounter (HOSPITAL_BASED_OUTPATIENT_CLINIC_OR_DEPARTMENT_OTHER): Payer: Self-pay | Admitting: Emergency Medicine

## 2019-02-21 DIAGNOSIS — M545 Low back pain, unspecified: Secondary | ICD-10-CM

## 2019-02-21 DIAGNOSIS — R1032 Left lower quadrant pain: Secondary | ICD-10-CM | POA: Insufficient documentation

## 2019-02-21 DIAGNOSIS — Z79899 Other long term (current) drug therapy: Secondary | ICD-10-CM | POA: Diagnosis not present

## 2019-02-21 LAB — URINALYSIS, ROUTINE W REFLEX MICROSCOPIC
Bilirubin Urine: NEGATIVE
Glucose, UA: NEGATIVE mg/dL
Hgb urine dipstick: NEGATIVE
Ketones, ur: NEGATIVE mg/dL
Nitrite: NEGATIVE
Protein, ur: NEGATIVE mg/dL
Specific Gravity, Urine: 1.025 (ref 1.005–1.030)
pH: 7 (ref 5.0–8.0)

## 2019-02-21 LAB — URINALYSIS, MICROSCOPIC (REFLEX)

## 2019-02-21 LAB — CBC WITH DIFFERENTIAL/PLATELET
Abs Immature Granulocytes: 0.01 10*3/uL (ref 0.00–0.07)
Basophils Absolute: 0 10*3/uL (ref 0.0–0.1)
Basophils Relative: 1 %
Eosinophils Absolute: 0.1 10*3/uL (ref 0.0–0.5)
Eosinophils Relative: 2 %
HCT: 39.1 % (ref 39.0–52.0)
Hemoglobin: 12 g/dL — ABNORMAL LOW (ref 13.0–17.0)
Immature Granulocytes: 0 %
Lymphocytes Relative: 33 %
Lymphs Abs: 1.8 10*3/uL (ref 0.7–4.0)
MCH: 26.3 pg (ref 26.0–34.0)
MCHC: 30.7 g/dL (ref 30.0–36.0)
MCV: 85.6 fL (ref 80.0–100.0)
Monocytes Absolute: 0.5 10*3/uL (ref 0.1–1.0)
Monocytes Relative: 10 %
Neutro Abs: 2.9 10*3/uL (ref 1.7–7.7)
Neutrophils Relative %: 54 %
Platelets: 212 10*3/uL (ref 150–400)
RBC: 4.57 MIL/uL (ref 4.22–5.81)
RDW: 14.4 % (ref 11.5–15.5)
WBC: 5.4 10*3/uL (ref 4.0–10.5)
nRBC: 0 % (ref 0.0–0.2)

## 2019-02-21 LAB — COMPREHENSIVE METABOLIC PANEL
ALT: 51 U/L — ABNORMAL HIGH (ref 0–44)
AST: 29 U/L (ref 15–41)
Albumin: 3.9 g/dL (ref 3.5–5.0)
Alkaline Phosphatase: 66 U/L (ref 38–126)
Anion gap: 5 (ref 5–15)
BUN: 11 mg/dL (ref 6–20)
CO2: 26 mmol/L (ref 22–32)
Calcium: 9 mg/dL (ref 8.9–10.3)
Chloride: 108 mmol/L (ref 98–111)
Creatinine, Ser: 0.88 mg/dL (ref 0.61–1.24)
GFR calc Af Amer: >60 mL/min (ref >60)
GFR calc non Af Amer: >60 mL/min (ref >60)
Glucose, Bld: 98 mg/dL (ref 70–99)
Potassium: 4 mmol/L (ref 3.5–5.1)
Sodium: 139 mmol/L (ref 135–145)
Total Bilirubin: 0.6 mg/dL (ref 0.3–1.2)
Total Protein: 7.3 g/dL (ref 6.5–8.1)

## 2019-02-21 LAB — LIPASE, BLOOD: Lipase: 28 U/L (ref 11–51)

## 2019-02-21 MED ORDER — MELOXICAM 7.5 MG PO TABS: 7.5000 mg | ORAL_TABLET | Freq: Every day | ORAL | 0 refills | Status: AC

## 2019-02-21 MED ORDER — KETOROLAC TROMETHAMINE 15 MG/ML IJ SOLN: 15.0000 mg | Freq: Once | INTRAMUSCULAR | Status: DC

## 2019-02-21 MED ORDER — ONDANSETRON HCL 4 MG/2ML IJ SOLN
4.0000 mg | Freq: Once | INTRAMUSCULAR | Status: AC
  Administered 2019-02-21: 11:00:00 4 mg via INTRAVENOUS
  Filled 2019-02-21: qty 2

## 2019-02-21 MED ORDER — IOHEXOL 300 MG/ML  SOLN
100.0000 mL | Freq: Once | INTRAMUSCULAR | Status: AC | PRN
  Administered 2019-02-21: 12:00:00 100 mL via INTRAVENOUS

## 2019-02-21 MED ORDER — MORPHINE SULFATE (PF) 4 MG/ML IV SOLN
4.0000 mg | Freq: Once | INTRAVENOUS | Status: AC
  Administered 2019-02-21: 4 mg via INTRAVENOUS
  Filled 2019-02-21: qty 1

## 2019-02-21 MED ORDER — METHOCARBAMOL 500 MG PO TABS: 500.0000 mg | ORAL_TABLET | Freq: Two times a day (BID) | ORAL | 0 refills | Status: DC

## 2019-02-21 NOTE — ED Provider Notes (Signed)
[facility] EMERGENCY DEPARTMENT Provider Note   CSN: [id number] Arrival date & time: 02/21/19  [id number]    History   Chief Complaint Chief Complaint  Patient presents with  . Back Pain    HPI Lesle ReekSamuel L Abrigo is a 58 y.o. male.     58 year old male presents with complaint of low back pain.  Patient states that he went to his PCP office on Tuesday for low back and testicle pain, was found to have left lower quadrant abdominal pain and was started on Cipro and Flagyl.  Patient's lab work from that visit was unremarkable, he was told that if he continued to have pain he should have a CT scan.  Patient states that he no longer has pain in his testicles, does have pain in his left lower abdomen and across his low back.  Low back pain is worse with movement especially bending over a sink to wash his hands.  Denies changes in bowel or bladder habits, nausea, vomiting, fever, groin numbness, denies falls or injuries. No other complaints or concerns.      Past Medical History:  Diagnosis Date  . Depression   . Enlarged prostate   . Hyperlipidemia   . Osteoarthritis     Patient Active Problem List   Diagnosis Date Noted  . Bilateral hand pain 02/04/2016  . Visit for preventive health examination 01/27/2016  . Family history of early CAD 01/27/2016  . Prostate cancer screening 01/27/2016  . Joint stiffness 01/27/2016  . Chronic knee pain 10/08/2015  . Tendinopathy of rotator cuff 10/08/2015    Past Surgical History:  Procedure Laterality Date  . ANKLE SURGERY  2008  . APPENDECTOMY  1988        Home Medications    Prior to Admission medications   Medication Sig Start Date End Date Taking? Authorizing Provider  diclofenac (VOLTAREN) 75 MG EC tablet TAKE 1 TABLET(75 MG) BY MOUTH TWICE DAILY 04/19/16   Waldon MerlMartin, William C, PA-C  meloxicam (MOBIC) 7.5 MG tablet Take 1 tablet (7.5 mg total) by mouth daily for 10 days. 02/21/19 03/03/19  Jeannie FendMurphy,  A, PA-C  methocarbamol  (ROBAXIN) 500 MG tablet Take 1 tablet (500 mg total) by mouth 2 (two) times daily. 02/21/19   Jeannie FendMurphy,  A, PA-C  Omega-3 Fatty Acids (FISH OIL) 1000 MG CAPS Take 1 capsule by mouth daily.    [provider]  Pitavastatin Calcium 4 MG TABS Take 1 tablet (4 mg total) by mouth daily. 02/01/16   Waldon MerlMartin, William C, PA-C  tamsulosin (FLOMAX) 0.4 MG CAPS capsule Take 1 capsule by mouth daily. 12/26/14   [provider]    Family History Family History  Problem Relation Age of Onset  . Ovarian cancer Mother   . Hypertension Mother   . Heart attack Father 3861  . Heart attack Brother 38  . Healthy Child        x 2    Social History Social History   Tobacco Use  . Smoking status: Never Smoker  . Smokeless tobacco: Never Used  Substance Use Topics  . Alcohol use: No    Alcohol/week: 0.0 standard drinks  . Drug use: No     Allergies   Statins   Review of Systems Review of Systems  Constitutional: Negative for chills and fever.  Gastrointestinal: Positive for abdominal pain. Negative for blood in stool, constipation, diarrhea, nausea and vomiting.  Genitourinary: Negative for decreased urine volume, dysuria, frequency, hematuria, scrotal swelling, testicular pain and  urgency.  Musculoskeletal: Positive for back pain. Negative for gait problem.  Skin: Negative for rash and wound.  Allergic/Immunologic: Negative for immunocompromised state.  Neurological: Negative for weakness and numbness.  All other systems reviewed and are negative.    Physical Exam Updated Vital Signs BP (!) 143/92 (BP Location: Left Arm)   Pulse 60   Temp 97.8 F (36.6 C) (Oral)   Resp 12   Ht 5\' 9"  (1.753 m)   Wt 104.3 kg   SpO2 100%   BMI 33.97 kg/m   Physical Exam Vitals signs and nursing note reviewed.  Constitutional:      General: He is not in acute distress.    Appearance: He is well-developed. He is not diaphoretic.  HENT:     Head: Normocephalic and atraumatic.   Pulmonary:     Effort: Pulmonary effort is normal.  Neurological:     Mental Status: He is alert and oriented to person, place, and time.  Psychiatric:        Behavior: Behavior normal.      ED Treatments / Results  Labs (all labs ordered are listed, but only abnormal results are displayed) Labs Reviewed  CBC WITH DIFFERENTIAL/PLATELET - Abnormal; Notable for the following components:      Result Value   Hemoglobin 12.0 (*)    All other components within normal limits  COMPREHENSIVE METABOLIC PANEL - Abnormal; Notable for the following components:   ALT 51 (*)    All other components within normal limits  URINALYSIS, ROUTINE W REFLEX MICROSCOPIC - Abnormal; Notable for the following components:   Leukocytes,Ua TRACE (*)    All other components within normal limits  URINALYSIS, MICROSCOPIC (REFLEX) - Abnormal; Notable for the following components:   Bacteria, UA FEW (*)    All other components within normal limits  LIPASE, BLOOD    EKG None  Radiology Ct Abdomen Pelvis W Contrast  Result Date: 02/21/2019 CLINICAL DATA:  Abdominal pain, diverticulitis suspected. Left-sided low back pain. Patient had left testicular pain 4 days ago. EXAM: CT ABDOMEN AND PELVIS WITH CONTRAST TECHNIQUE: Multidetector CT imaging of the abdomen and pelvis was performed using the standard protocol following bolus administration of intravenous contrast. CONTRAST:  OMNIPAQUE IOHEXOL 300 MG/ML  SOLN COMPARISON:  CT abdomen and pelvis 07/10/2017 FINDINGS: Lower chest: Lung bases are clear without focal nodule, mass, or airspace disease. The heart size is normal. There is no significant pleural or pericardial effusion. Hepatobiliary: There is diffuse fatty infiltration of the liver. No discrete lesions are present. There is no significant interval change. Common bile duct and gallbladder are normal. Pancreas: Unremarkable. No pancreatic ductal dilatation or surrounding inflammatory changes. Spleen:  Normal in size without focal abnormality. Adrenals/Urinary Tract: Adrenal glands are normal bilaterally. An 8 mm stone is present in the midportion of the right kidney, nonobstructing. 6 mm stone is present at the lower pole of the left kidney. The stones are both present on the prior exam. They have increased somewhat in size. No obstructing stones are present. There is no hydronephrosis. Subcentimeter cysts are present bilaterally. No inflammatory changes are present about either ureter. The urinary bladder is within normal limits. Stomach/Bowel: Stomach and duodenum are within normal limits. Small bowel is unremarkable. Terminal ileum is normal. Ascending and transverse colon are within normal limits. Descending colon is unremarkable. Sigmoid colon is within normal limits. There is no diverticular change or focal inflammation. Vascular/Lymphatic: Mild atherosclerotic changes are noted in the aorta and branch vessels without aneurysm.  Reproductive: Prostate is mildly enlarged, measuring up to 4.9 cm in transverse dimension. Other: No abdominal wall hernia or abnormality. No abdominopelvic ascites. Musculoskeletal: Exaggerated lumbar lordosis is again seen. Vertebral body heights alignment are maintained. Transitional S1 segment is noted. No focal lytic or blastic lesions are present. Degenerative disc disease greatest at L3-4. Bony pelvis is within normal limits. The hips are located and normal. IMPRESSION: 1. Bilateral nephrolithiasis without obstructing disease. 2. No hydronephrosis or inflammatory changes about the ureters. 3. Normal appearance of the colon. No evidence for colitis. No diverticulosis. 4. No acute or focal abnormality to explain the patient's pain. 5. Hepatic steatosis is again noted.  No discrete lesions. 6.  Aortic Atherosclerosis (ICD10-I70.0). Electronically Signed   By: Marin Roberts M.D.   On: 02/21/2019 12:06    Procedures Procedures (including critical care time)   Medications Ordered in ED Medications  ketorolac (TORADOL) 15 MG/ML injection 15 mg (has no administration in time range)  morphine 4 MG/ML injection 4 mg (4 mg Intravenous Given 02/21/19 1053)  ondansetron (ZOFRAN) injection 4 mg (4 mg Intravenous Given 02/21/19 1052)  iohexol (OMNIPAQUE) 300 MG/ML solution 100 mL (100 mLs Intravenous Contrast Given 02/21/19 1147)     Initial Impression / Assessment and Plan / ED Course  I have reviewed the triage vital signs and the nursing notes.  Pertinent labs & imaging results that were available during my care of the patient were reviewed by me and considered in my medical decision making (see chart for details).  Clinical Course as of Feb 21 1423  Fri Feb 21, 2019  1420 58yo male with complaint of low back pain with LLQ abdominal pain, seen at PCP office 5/19 and started on Cipro and Flagyl for possible diverticulitis. On exam, low back tenderness, mild LLQ tenderness. No red flag symptoms. Labs unremarkable and unchanged from PCP labs 3 days ago. CBC with mild anemia, hgb 12, CMP without significant changes, UA with trace leukocytes, few bacteria, no urinary symptoms. Lipase WNL. CT abdomen and pelvis with contrast, no lytic lesions, no diverticulitis/osis. Pain somewhat improved with morphine and zofran. Plan was to give Toradol at discharge however was not given/forgotten after patient unhappy with being told he would need a ride home due to being given Morphine (patient was made aware this would be the plan when he opted to have pain medication and his wife is in the parking lot). Suspect musculoskeletal cause of pain. Recommend gentle stretching, warm compresses, given rx for Meloxicam and Robaxin with plan to follow up with PCP. Advised to stop the antibiotics. Return to the ER for new or worsening symptoms.    [LM]    Clinical Course User Index [LM] Jeannie Fend, PA-C      Final Clinical Impressions(s) / ED Diagnoses   Final diagnoses:   Acute bilateral low back pain without sciatica  Left lower quadrant abdominal pain    ED Discharge Orders         Ordered    meloxicam (MOBIC) 7.5 MG tablet  Daily     02/21/19 1226    methocarbamol (ROBAXIN) 500 MG tablet  2 times daily     02/21/19 1226           Alden Hipp 02/21/19 1424    Little, Ambrose Finland, MD 02/22/19 [phone]

## 2019-02-21 NOTE — ED Notes (Signed)
Pt indicated he planned to drive home. Pt was told explicitly by RN and PA that he could not drive if he received Morphine prior to receiving it. Pt said he would be able to get a ride from someone and chose to receive Morphine. RN notified [employer] officer on duty re: this situation and then informed pt that officer was aware.

## 2019-02-21 NOTE — ED Notes (Signed)
Pt did not receive Toradol prior to leaving; EDP aware.

## 2019-02-21 NOTE — Discharge Instructions (Signed)
Apply warm compresses to low back for 20 minutes at a time. Gentle stretching. Apply topical lidocaine patches, these are available over the counter. Take Meloxicam daily. Take Robaxin as prescribed. Follow up with your doctor. STOP the antibiotics prescribed by your doctor this week.

## 2019-02-21 NOTE — ED Triage Notes (Signed)
Pt report lower back pain that originally started as testicular pain on Tues. Denies difficulty urinating.

## 2021-03-21 IMAGING — CT CT ABDOMEN AND PELVIS WITH CONTRAST
2 of 5 series · 16 of 46 positions shown, 18 images · IV contrast (omnipaque)
Comparison: CT abdomen and pelvis 07/10/2017

CLINICAL DATA: Abdominal pain, diverticulitis suspected. Left-sided
low back pain. Patient had left testicular pain 4 days ago.

EXAM:
CT ABDOMEN AND PELVIS WITH CONTRAST
TECHNIQUE: Multidetector CT imaging of the abdomen and pelvis was performed
using the standard protocol following bolus administration of
intravenous contrast.
CONTRAST:  100mL OMNIPAQUE IOHEXOL 300 MG/ML  SOLN

[Series 2: axial st · axial · 0.96mm/px · z∈[-444,+6]mm · 13 of 103 slices shown, 15 images]
[im 7/103  soft-tissue]
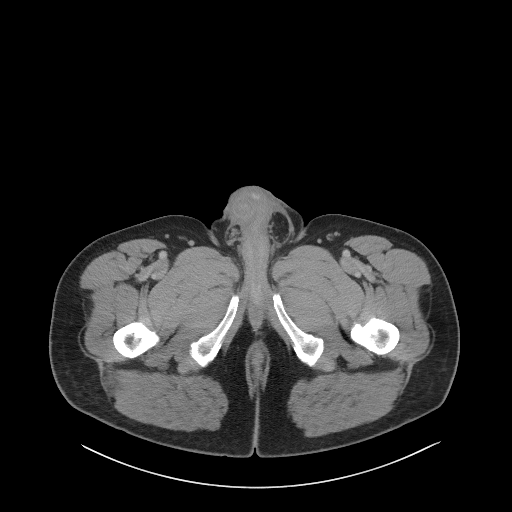
[im 7/103  bone]
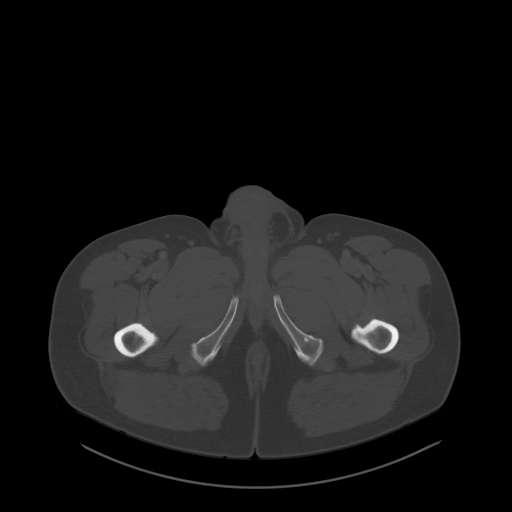
[im 13/103  soft-tissue]
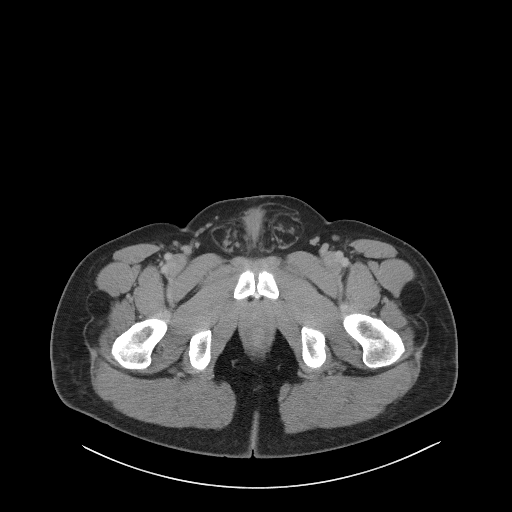
[im 25/103  soft-tissue]
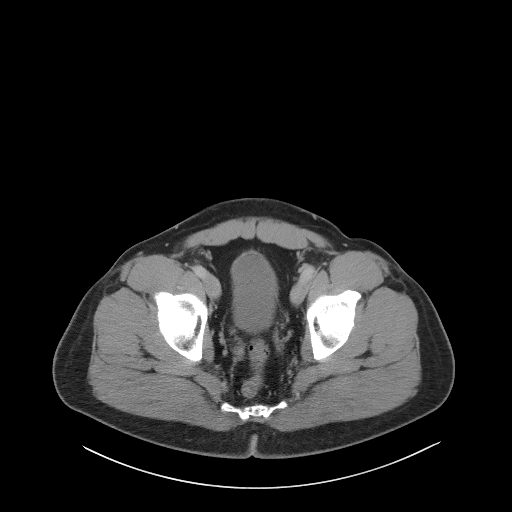
[im 31/103  soft-tissue]
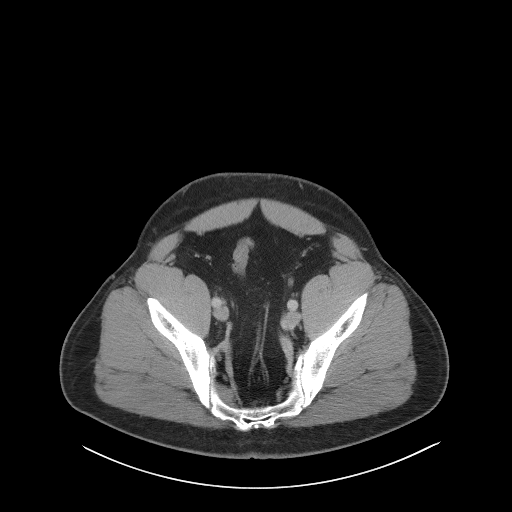
[im 37/103  soft-tissue]
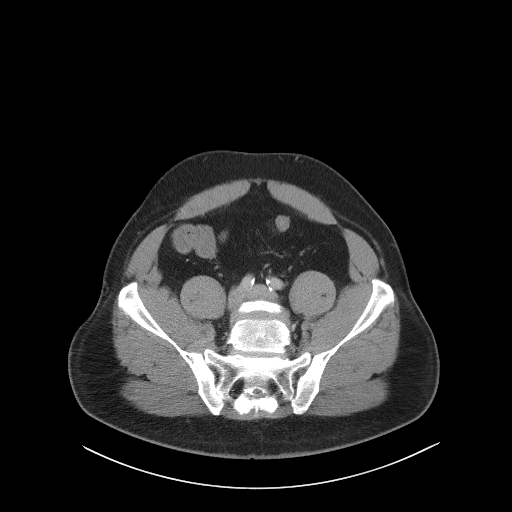
[im 43/103  soft-tissue]
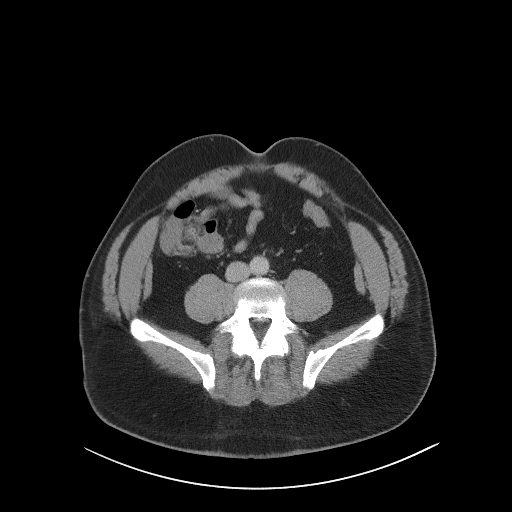
[im 55/103  soft-tissue]
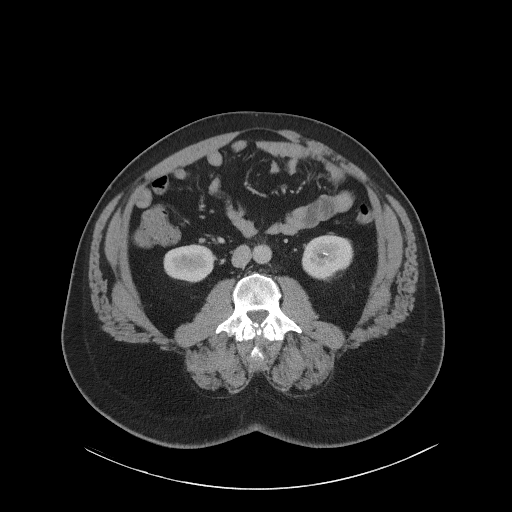
[im 61/103  soft-tissue]
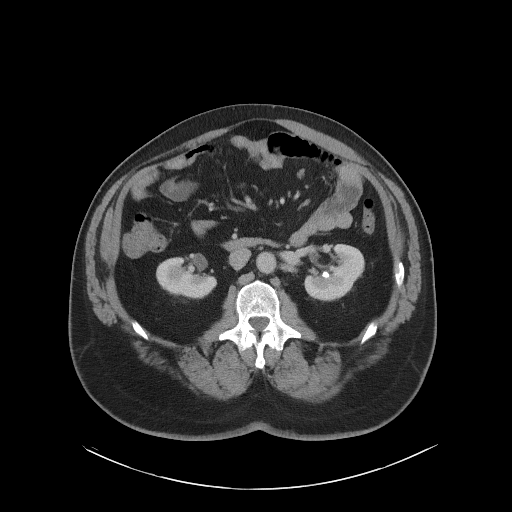
[im 67/103  soft-tissue]
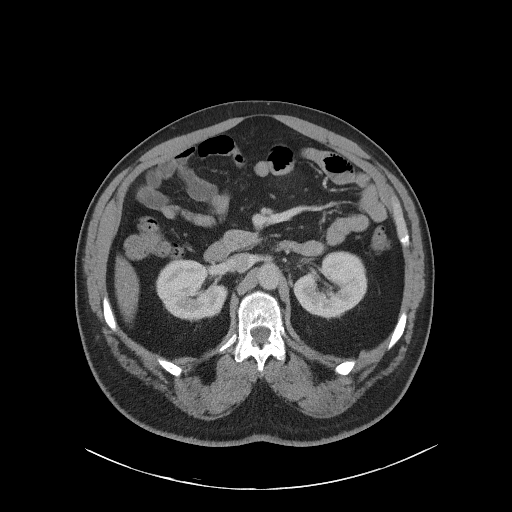
[im 67/103  bone]
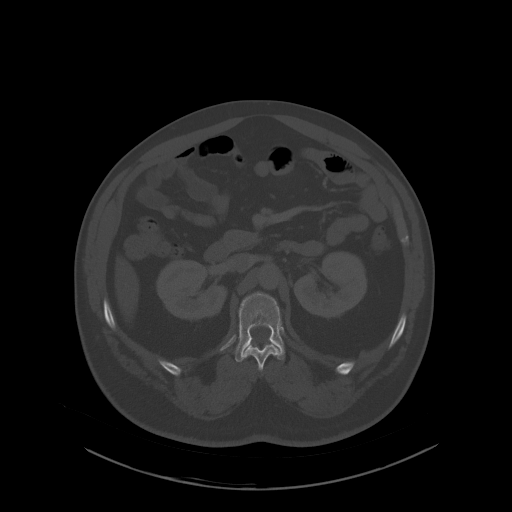
[im 73/103  soft-tissue]
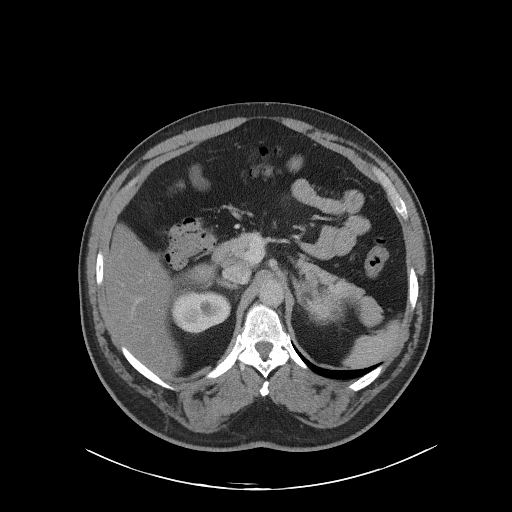
[im 79/103  soft-tissue]
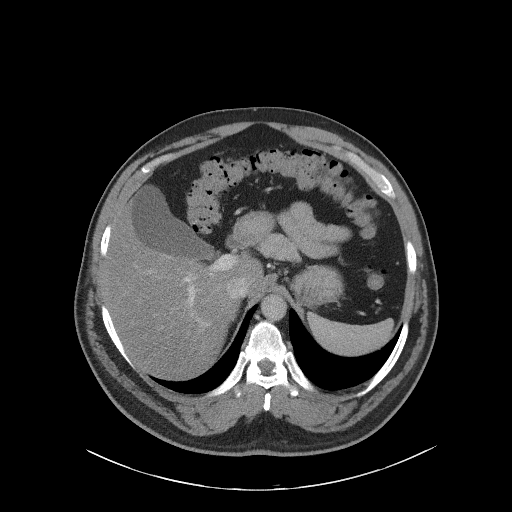
[im 91/103  soft-tissue]
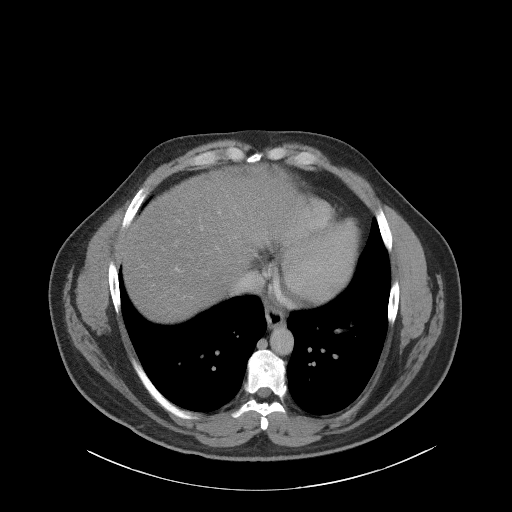
[im 97/103  soft-tissue]
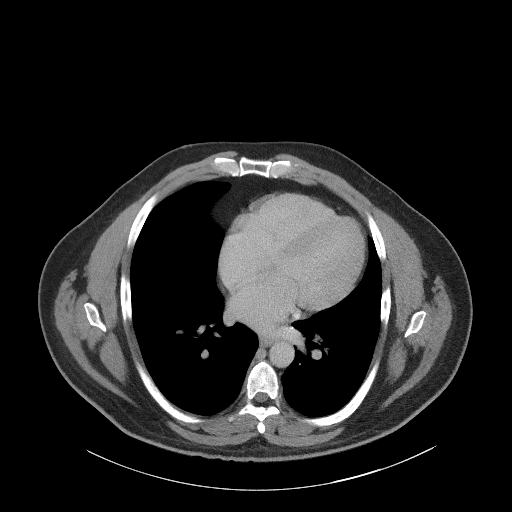

[Series 5: coronal st · coronal · 0.94mm/px · 3 of 113 slices shown]
[im 38/113  soft-tissue]
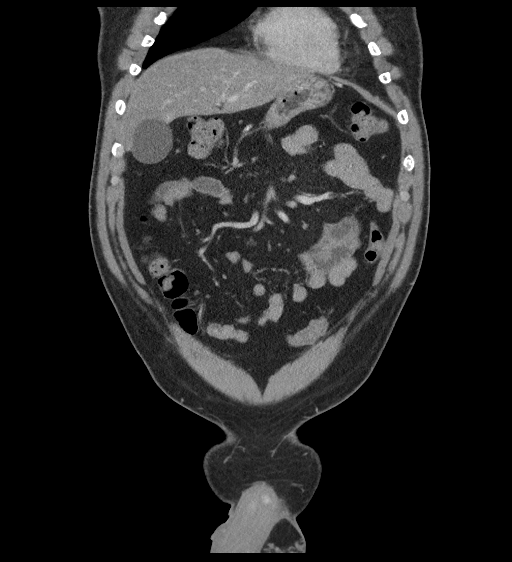
[im 50/113  soft-tissue]
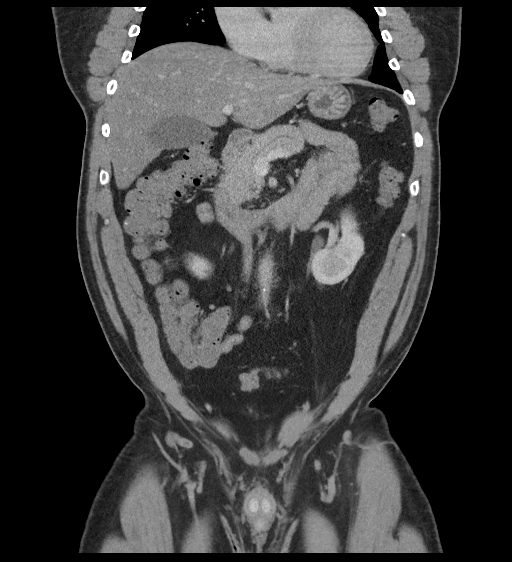
[im 63/113  soft-tissue]
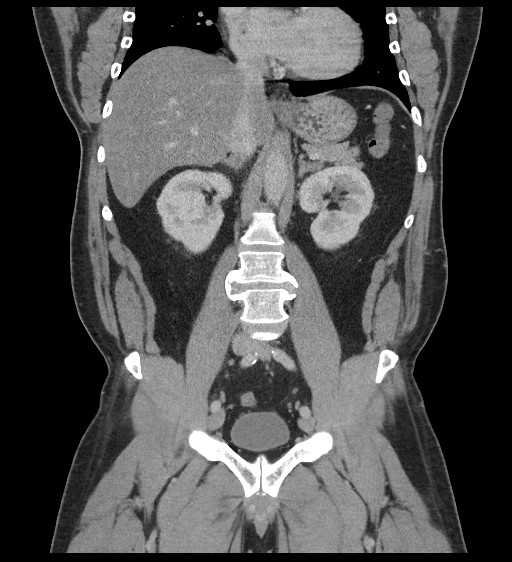

[16 of 46 positions shown; findings below may reference images not displayed]

FINDINGS: Lower chest: Lung bases are clear without focal nodule, mass, or
airspace disease. The heart size is normal. There is no significant
pleural or pericardial effusion.

Hepatobiliary: There is diffuse fatty infiltration of the liver. No
discrete lesions are present. There is no significant interval
change. Common bile duct and gallbladder are normal.

Pancreas: Unremarkable. No pancreatic ductal dilatation or
surrounding inflammatory changes.

Spleen: Normal in size without focal abnormality.

Adrenals/Urinary Tract: Adrenal glands are normal bilaterally. An 8
mm stone is present in the midportion of the right kidney,
nonobstructing. 6 mm stone is present at the lower pole of the left
kidney. The stones are both present on the prior exam. They have
increased somewhat in size. No obstructing stones are present. There
is no hydronephrosis. Subcentimeter cysts are present bilaterally.
No inflammatory changes are present about either ureter. The urinary
bladder is within normal limits.

Stomach/Bowel: Stomach and duodenum are within normal limits. Small
bowel is unremarkable. Terminal ileum is normal. Ascending and
transverse colon are within normal limits. Descending colon is
unremarkable. Sigmoid colon is within normal limits. There is no
diverticular change or focal inflammation.

Vascular/Lymphatic: Mild atherosclerotic changes are noted in the
aorta and branch vessels without aneurysm.

Reproductive: Prostate is mildly enlarged, measuring up to 4.9 cm in
transverse dimension.

Other: No abdominal wall hernia or abnormality. No abdominopelvic
ascites.

Musculoskeletal: Exaggerated lumbar lordosis is again seen.
Vertebral body heights alignment are maintained. Transitional S1
segment is noted. No focal lytic or blastic lesions are present.
Degenerative disc disease greatest at L3-4. Bony pelvis is within
normal limits. The hips are located and normal.
IMPRESSION: 1. Bilateral nephrolithiasis without obstructing disease.
2. No hydronephrosis or inflammatory changes about the ureters.
3. Normal appearance of the colon. No evidence for colitis. No
diverticulosis.
4. No acute or focal abnormality to explain the patient's pain.
5. Hepatic steatosis is again noted.  No discrete lesions.
6.  Aortic Atherosclerosis ([id number]).

## 2021-07-29 ENCOUNTER — Emergency Department (HOSPITAL_BASED_OUTPATIENT_CLINIC_OR_DEPARTMENT_OTHER)
Admission: EM | Admit: 2021-07-29 | Discharge: 2021-07-29 | Disposition: A | Discharge status: Home / Self Care | Payer: BC Managed Care – PPO | Attending: Emergency Medicine | Admitting: Emergency Medicine

## 2021-07-29 ENCOUNTER — Other Ambulatory Visit: Payer: Self-pay

## 2021-07-29 ENCOUNTER — Encounter (HOSPITAL_BASED_OUTPATIENT_CLINIC_OR_DEPARTMENT_OTHER): Payer: Self-pay | Admitting: Emergency Medicine

## 2021-07-29 DIAGNOSIS — S39012A Strain of muscle, fascia and tendon of lower back, initial encounter: Secondary | ICD-10-CM | POA: Diagnosis not present

## 2021-07-29 DIAGNOSIS — X509XXA Other and unspecified overexertion or strenuous movements or postures, initial encounter: Secondary | ICD-10-CM | POA: Insufficient documentation

## 2021-07-29 DIAGNOSIS — I10 Essential (primary) hypertension: Secondary | ICD-10-CM | POA: Diagnosis not present

## 2021-07-29 DIAGNOSIS — M549 Dorsalgia, unspecified: Secondary | ICD-10-CM | POA: Diagnosis present

## 2021-07-29 HISTORY — DX: Disorder of kidney and ureter, unspecified: N28.9

## 2021-07-29 LAB — URINALYSIS, ROUTINE W REFLEX MICROSCOPIC
Bilirubin Urine: NEGATIVE
Glucose, UA: NEGATIVE mg/dL
Hgb urine dipstick: NEGATIVE
Ketones, ur: NEGATIVE mg/dL
Leukocytes,Ua: NEGATIVE
Nitrite: NEGATIVE
Protein, ur: NEGATIVE mg/dL
Specific Gravity, Urine: 1.025 (ref 1.005–1.030)
pH: 7 (ref 5.0–8.0)

## 2021-07-29 MED ORDER — KETOROLAC TROMETHAMINE 30 MG/ML IJ SOLN
30.0000 mg | Freq: Once | INTRAMUSCULAR | Status: AC
  Administered 2021-07-29: 30 mg via INTRAMUSCULAR

## 2021-07-29 MED ORDER — KETOROLAC TROMETHAMINE 30 MG/ML IJ SOLN
30.0000 mg | Freq: Once | INTRAMUSCULAR | Status: DC
  Filled 2021-07-29: qty 1

## 2021-07-29 MED ORDER — NAPROXEN 375 MG PO TABS: 375.0000 mg | ORAL_TABLET | Freq: Two times a day (BID) | ORAL | 0 refills | Status: AC

## 2021-07-29 MED ORDER — ACETAMINOPHEN 500 MG PO TABS
1000.0000 mg | ORAL_TABLET | Freq: Once | ORAL | Status: AC
  Administered 2021-07-29: 1000 mg via ORAL
  Filled 2021-07-29: qty 2

## 2021-07-29 MED ORDER — METHOCARBAMOL 500 MG PO TABS: 500.0000 mg | ORAL_TABLET | Freq: Two times a day (BID) | ORAL | 0 refills | Status: AC

## 2021-07-29 NOTE — ED Triage Notes (Addendum)
Lower back pain x 3 days   denies dysuria , hx of kidney stones  has lithotripsy 2 months ago worked under house on wed on his well

## 2021-07-29 NOTE — Discharge Instructions (Addendum)
Do not take Voltaren while taking these medications.  Try applying heat to help with the muscle spasms.  You can also apply topical lidocaine patches.  Follow-up with your doctor if the symptoms are not improving

## 2021-07-29 NOTE — ED Provider Notes (Signed)
[facility] EMERGENCY DEPARTMENT Provider Note   CSN: [id number] Arrival date & time: 07/29/21  0850     History Chief complaint: Back pain  Nathaniel Vasquez is a 60 y.o. male.  HPI  Patient presents the ED for evaluation of back pain.  Patient states the symptoms started on Wednesday.  Patient was working on his house on Wednesday and was crawling around in the crawl space.  He had to bend over a lot.  He did not do his symptoms started after that.  He does have history of kidney stones on both sides before but this does not feel like his kidney stone pain.  He denies any dysuria.  He has not noticed any blood in his urine.  He denies any focal numbness or weakness.  Pain in his lower back is worse with bending over and any movements.  No fevers or chills  Past Medical History:  Diagnosis Date   Depression    Enlarged prostate    Hyperlipidemia    Osteoarthritis    Renal disorder     Patient Active Problem List   Diagnosis Date Noted   Bilateral hand pain 02/04/2016   Visit for preventive health examination 01/27/2016   Family history of early CAD 01/27/2016   Prostate cancer screening 01/27/2016   Joint stiffness 01/27/2016   Chronic knee pain 10/08/2015   Tendinopathy of rotator cuff 10/08/2015    Past Surgical History:  Procedure Laterality Date   ANKLE SURGERY  2008   APPENDECTOMY  1988       Family History  Problem Relation Age of Onset   Ovarian cancer Mother    Hypertension Mother    Heart attack Father 9   Heart attack Brother 64   Healthy Child        x 2    Social History   Tobacco Use   Smoking status: Never   Smokeless tobacco: Never  Vaping Use   Vaping Use: Never used  Substance Use Topics   Alcohol use: No    Alcohol/week: 0.0 standard drinks   Drug use: No    Home Medications Prior to Admission medications   Medication Sig Start Date End Date Taking? Authorizing Provider  naproxen (NAPROSYN) 375 MG tablet Take 1 tablet  (375 mg total) by mouth 2 (two) times daily. 07/29/21  Yes Linwood Dibbles, MD  diclofenac (VOLTAREN) 75 MG EC tablet TAKE 1 TABLET(75 MG) BY MOUTH TWICE DAILY 04/19/16   Waldon Merl, PA-C  methocarbamol (ROBAXIN) 500 MG tablet Take 1 tablet (500 mg total) by mouth 2 (two) times daily. 07/29/21   Linwood Dibbles, MD  Omega-3 Fatty Acids (FISH OIL) 1000 MG CAPS Take 1 capsule by mouth daily.    [provider]  Pitavastatin Calcium 4 MG TABS Take 1 tablet (4 mg total) by mouth daily. 02/01/16   Waldon Merl, PA-C  tamsulosin (FLOMAX) 0.4 MG CAPS capsule Take 1 capsule by mouth daily. 12/26/14   [provider]    Allergies    Statins  Review of Systems   Review of Systems  All other systems reviewed and are negative.  Physical Exam Updated Vital Signs BP (!) 161/100   Pulse (!) 59   Temp 98 F (36.7 C) (Oral)   Resp 16   Ht 1.753 m (5\' 9" )   Wt 104.3 kg   SpO2 97%   BMI 33.97 kg/m   Physical Exam Vitals and nursing note reviewed.  Constitutional:  Appearance: He is well-developed. He is not diaphoretic.  HENT:     Head: Normocephalic and atraumatic.     Right Ear: External ear normal.     Left Ear: External ear normal.  Eyes:     General: No scleral icterus.       Right eye: No discharge.        Left eye: No discharge.     Conjunctiva/sclera: Conjunctivae normal.  Neck:     Trachea: No tracheal deviation.  Cardiovascular:     Rate and Rhythm: Normal rate and regular rhythm.  Pulmonary:     Effort: Pulmonary effort is normal. No respiratory distress.     Breath sounds: Normal breath sounds. No stridor. No wheezing or rales.  Abdominal:     General: Bowel sounds are normal. There is no distension.     Palpations: Abdomen is soft.     Tenderness: There is no abdominal tenderness. There is no guarding or rebound.  Musculoskeletal:        General: No deformity.     Cervical back: Neck supple.     Lumbar back: Spasms and tenderness present. No edema  or bony tenderness.  Skin:    General: Skin is warm and dry.     Findings: No rash.  Neurological:     General: No focal deficit present.     Mental Status: He is alert.     Cranial Nerves: No cranial nerve deficit (no facial droop, extraocular movements intact, no slurred speech).     Sensory: No sensory deficit.     Motor: No abnormal muscle tone or seizure activity.     Coordination: Coordination normal.  Psychiatric:        Mood and Affect: Mood normal.    ED Results / Procedures / Treatments   Labs (all labs ordered are listed, but only abnormal results are displayed) Labs Reviewed  URINALYSIS, ROUTINE W REFLEX MICROSCOPIC    EKG None  Radiology No results found.  Procedures Procedures   Medications Ordered in ED Medications  acetaminophen (TYLENOL) tablet 1,000 mg (1,000 mg Oral Given 07/29/21 1033)  ketorolac (TORADOL) 30 MG/ML injection 30 mg (30 mg Intramuscular Given 07/29/21 1033)    ED Course  I have reviewed the triage vital signs and the nursing notes.  Pertinent labs & imaging results that were available during my care of the patient were reviewed by me and considered in my medical decision making (see chart for details).  Clinical Course as of 07/29/21 1104  Fri Jul 29, 2021  1033 Urinalysis negative [JK]    Clinical Course User Index [JK] Linwood Dibbles, MD   MDM Rules/Calculators/A&P                           Patient presented to the ED for evaluation of back pain.  No signs of infection on exam.  No acute neurologic dysfunction.  No abdominal pain to suggest referred etiology.  Patient does have tenderness palpation of the paraspinal region.  No signs of sciatica on exam urinalysis does not show any signs of infection or blood to suggest radiology.  Suspect musculoskeletal lumbar strain  Reviewed patient's high blood pressure.  He states normally his blood pressure runs fine he does monitor at home.  He thinks is related to the pain.  Discussed  having him follow-up with his doctor to have that rechecked Final Clinical Impression(s) / ED Diagnoses Final diagnoses:  Strain of lumbar  region, initial encounter  Hypertension, unspecified type    Rx / DC Orders ED Discharge Orders          Ordered    methocarbamol (ROBAXIN) 500 MG tablet  2 times daily        07/29/21 1101    naproxen (NAPROSYN) 375 MG tablet  2 times daily        07/29/21 1101             Linwood Dibbles, MD 07/29/21 1105

## 2023-04-04 ENCOUNTER — Encounter (HOSPITAL_BASED_OUTPATIENT_CLINIC_OR_DEPARTMENT_OTHER): Payer: Self-pay | Admitting: Emergency Medicine

## 2023-04-04 ENCOUNTER — Emergency Department (HOSPITAL_BASED_OUTPATIENT_CLINIC_OR_DEPARTMENT_OTHER): Payer: BLUE CROSS/BLUE SHIELD

## 2023-04-04 ENCOUNTER — Other Ambulatory Visit: Payer: Self-pay

## 2023-04-04 ENCOUNTER — Emergency Department (HOSPITAL_BASED_OUTPATIENT_CLINIC_OR_DEPARTMENT_OTHER)
Admission: EM | Admit: 2023-04-04 | Discharge: 2023-04-05 | Disposition: A | Discharge status: Home / Self Care | Payer: BLUE CROSS/BLUE SHIELD | Attending: Emergency Medicine | Admitting: Emergency Medicine

## 2023-04-04 DIAGNOSIS — N132 Hydronephrosis with renal and ureteral calculous obstruction: Secondary | ICD-10-CM | POA: Insufficient documentation

## 2023-04-04 DIAGNOSIS — R109 Unspecified abdominal pain: Secondary | ICD-10-CM | POA: Diagnosis not present

## 2023-04-04 DIAGNOSIS — N201 Calculus of ureter: Secondary | ICD-10-CM

## 2023-04-04 LAB — URINALYSIS, MICROSCOPIC (REFLEX): WBC, UA: NONE SEEN WBC/hpf (ref 0–5)

## 2023-04-04 LAB — COMPREHENSIVE METABOLIC PANEL
ALT: 44 U/L (ref 0–44)
AST: 31 U/L (ref 15–41)
Albumin: 4 g/dL (ref 3.5–5.0)
Alkaline Phosphatase: 89 U/L (ref 38–126)
Anion gap: 4 — ABNORMAL LOW (ref 5–15)
BUN: 15 mg/dL (ref 8–23)
CO2: 28 mmol/L (ref 22–32)
Calcium: 8.7 mg/dL — ABNORMAL LOW (ref 8.9–10.3)
Chloride: 105 mmol/L (ref 98–111)
Creatinine, Ser: 1.01 mg/dL (ref 0.61–1.24)
GFR, Estimated: >60 mL/min (ref >60)
Glucose, Bld: 121 mg/dL — ABNORMAL HIGH (ref 70–99)
Potassium: 3.8 mmol/L (ref 3.5–5.1)
Sodium: 137 mmol/L (ref 135–145)
Total Bilirubin: 0.5 mg/dL (ref 0.3–1.2)
Total Protein: 7.8 g/dL (ref 6.5–8.1)

## 2023-04-04 LAB — URINALYSIS, ROUTINE W REFLEX MICROSCOPIC
Bilirubin Urine: NEGATIVE
Glucose, UA: NEGATIVE mg/dL
Ketones, ur: NEGATIVE mg/dL
Leukocytes,Ua: NEGATIVE
Nitrite: NEGATIVE
Protein, ur: NEGATIVE mg/dL
Specific Gravity, Urine: 1.025 (ref 1.005–1.030)
pH: 6.5 (ref 5.0–8.0)

## 2023-04-04 LAB — CBC
HCT: 39.3 % (ref 39.0–52.0)
Hemoglobin: 12.5 g/dL — ABNORMAL LOW (ref 13.0–17.0)
MCH: 26.8 pg (ref 26.0–34.0)
MCHC: 31.8 g/dL (ref 30.0–36.0)
MCV: 84.3 fL (ref 80.0–100.0)
Platelets: 224 10*3/uL (ref 150–400)
RBC: 4.66 MIL/uL (ref 4.22–5.81)
RDW: 14.8 % (ref 11.5–15.5)
WBC: 6.6 10*3/uL (ref 4.0–10.5)
nRBC: 0 % (ref 0.0–0.2)

## 2023-04-04 LAB — LIPASE, BLOOD: Lipase: 38 U/L (ref 11–51)

## 2023-04-04 MED ORDER — ONDANSETRON HCL 4 MG/2ML IJ SOLN
4.0000 mg | Freq: Once | INTRAMUSCULAR | Status: AC
  Administered 2023-04-04: 4 mg via INTRAVENOUS
  Filled 2023-04-04: qty 2

## 2023-04-04 MED ORDER — KETOROLAC TROMETHAMINE 30 MG/ML IJ SOLN
30.0000 mg | Freq: Once | INTRAMUSCULAR | Status: AC
  Administered 2023-04-04: 30 mg via INTRAVENOUS
  Filled 2023-04-04: qty 1

## 2023-04-04 NOTE — ED Notes (Signed)
Patient transported to CT 

## 2023-04-04 NOTE — ED Triage Notes (Signed)
Patient endorses flank and abdominal pain both left and right sides since yesterday. Describes it as a throbbing pain. Denies painful urination. Believes he may have kidney stones.  Visibly in pain in triage.

## 2023-04-04 NOTE — ED Provider Notes (Signed)
Emergency Department Provider Note   I have reviewed the triage vital signs and the nursing notes.   HISTORY  Chief Complaint Flank Pain and Abdominal Pain   HPI Nathaniel Vasquez is a 62 y.o. male with prior history of ureteral stone and hyperlipidemia presents emergency department with right flank pain starting today.  Pain is severe with associated nausea.  Pain radiates around to the anterior/lower abdomen.  No fevers or chills.  No gross hematuria.  Patient notes that this feels similar to prior kidney stones.  He has required lithotripsy in the past.   Past Medical History:  Diagnosis Date   Depression    Enlarged prostate    Hyperlipidemia    Osteoarthritis    Renal disorder     Review of Systems  Constitutional: No fever/chills Cardiovascular: Denies chest pain. Respiratory: Denies shortness of breath. Gastrointestinal: Positive right flank abdominal pain. Occasional nausea, no vomiting.  No diarrhea.  No constipation. Genitourinary: Negative for dysuria. Musculoskeletal: Negative for back pain. Skin: Negative for rash. Neurological: Negative for headaches.   ____________________________________________   PHYSICAL EXAM:  VITAL SIGNS: ED Triage Vitals  Enc Vitals Group     BP 04/04/23 2306 (!) 192/86     Pulse Rate 04/04/23 2306 (!) 58     Resp 04/04/23 2306 20     Temp 04/04/23 2306 98.2 F (36.8 C)     Temp src --      SpO2 04/04/23 2306 97 %     Weight 04/04/23 2304 226 lb (102.5 kg)     Height 04/04/23 2304 5\' 9"  (1.753 m)   Constitutional: Alert and oriented. Standing at bedside bent over and in pain. Able to provide a full history.  Eyes: Conjunctivae are normal.  Head: Atraumatic. Nose: No congestion/rhinnorhea. Mouth/Throat: Mucous membranes are moist.  Neck: No stridor.   Cardiovascular: Normal rate, regular rhythm. Good peripheral circulation. Grossly normal heart sounds.   Respiratory: Normal respiratory effort.  No retractions. Lungs  CTAB. Gastrointestinal: Soft and nontender. No distention.  Musculoskeletal: No lower extremity tenderness nor edema. No gross deformities of extremities. Neurologic:  Normal speech and language. No gross focal neurologic deficits are appreciated.  Skin:  Skin is warm, dry and intact. No rash noted.  ____________________________________________   LABS (all labs ordered are listed, but only abnormal results are displayed)  Labs Reviewed  COMPREHENSIVE METABOLIC PANEL - Abnormal; Notable for the following components:      Result Value   Glucose, Bld 121 (*)    Calcium 8.7 (*)    Anion gap 4 (*)    All other components within normal limits  CBC - Abnormal; Notable for the following components:   Hemoglobin 12.5 (*)    All other components within normal limits  URINALYSIS, ROUTINE W REFLEX MICROSCOPIC - Abnormal; Notable for the following components:   Hgb urine dipstick MODERATE (*)    All other components within normal limits  URINALYSIS, MICROSCOPIC (REFLEX) - Abnormal; Notable for the following components:   Bacteria, UA RARE (*)    All other components within normal limits  LIPASE, BLOOD   ____________________________________________  RADIOLOGY  CT Renal Stone Study  Result Date: 04/05/2023 CLINICAL DATA:  Right flank pain EXAM: CT ABDOMEN AND PELVIS WITHOUT CONTRAST TECHNIQUE: Multidetector CT imaging of the abdomen and pelvis was performed following the standard protocol without IV contrast. RADIATION DOSE REDUCTION: This exam was performed according to the departmental dose-optimization program which includes automated exposure control, adjustment of the mA and/or  kV according to patient size and/or use of iterative reconstruction technique. COMPARISON:  02/15/2022 FINDINGS: Lower chest: Lung bases are clear. Hepatobiliary: Mild hepatic steatosis. Gallbladder is unremarkable. No intrahepatic or extrahepatic duct dilatation. Pancreas: Within normal limits. Spleen: Within normal  limits. Adrenals/Urinary Tract: Adrenal glands are within normal limits. 3 mm distal right ureteral calculus below the level of the sacral ureter (coronal image 47). Associated mild right hydroureteronephrosis. Mild right perinephric stranding/perirenal edema. 5 mm nonobstructing right upper pole renal calculus (series 2/image 30). Three nonobstructing left renal calculi measuring up to 5 mm (series 2/image 34). Bladder is within normal limits. Stomach/Bowel: Stomach is within normal limits. No evidence of bowel obstruction. Appendix is not discretely visualized. No colonic wall thickening or inflammatory changes. Vascular/Lymphatic: No evidence of abdominal aortic aneurysm. Atherosclerotic calcifications of the abdominal aorta and branch vessels. No suspicious abdominopelvic lymphadenopathy. Reproductive: Prostatomegaly, suggesting BPH. Other: No abdominopelvic ascites. Mild fat in the bilateral inguinal canals. Musculoskeletal: Mild degenerative changes of the visualized thoracolumbar spine. IMPRESSION: 3 mm distal right ureteral calculus, as above. Associated mild right hydroureteronephrosis. Additional nonobstructing bilateral renal calculi measuring up to 5 mm. Prostatomegaly, suggesting BPH. Electronically Signed   By: Charline Bills M.D.   On: 04/05/2023 00:16    ____________________________________________   PROCEDURES  Procedure(s) performed:   Procedures  None  ____________________________________________   INITIAL IMPRESSION / ASSESSMENT AND PLAN / ED COURSE  Pertinent labs & imaging results that were available during my care of the patient were reviewed by me and considered in my medical decision making (see chart for details).   This patient is Presenting for Evaluation of flank pain, which does require a range of treatment options, and is a complaint that involves a high risk of morbidity and mortality.  The Differential Diagnoses includes but is not exclusive to acute  appendicitis, renal colic, testicular torsion, urinary tract infection, prostatitis,  diverticulitis, small bowel obstruction, colitis, abdominal aortic aneurysm, gastroenteritis, constipation etc.   Critical Interventions-    Medications  ketorolac (TORADOL) 30 MG/ML injection 30 mg (30 mg Intravenous Given 04/04/23 2348)  ondansetron (ZOFRAN) injection 4 mg (4 mg Intravenous Given 04/04/23 2348)  HYDROmorphone (DILAUDID) injection 1 mg (1 mg Intravenous Given 04/05/23 0011)    Reassessment after intervention:  symptoms well controlled.   Clinical Laboratory Tests Ordered, included UA shows moderate hemoglobin with no clear infection.  CBC without leukocytosis.  No acute kidney injury.  Radiologic Tests Ordered, included CT renal. I independently interpreted the images and agree with radiology interpretation.   Cardiac Monitor Tracing which shows NSR.    Social Determinants of Health Risk patient is a non-smoker.   Medical Decision Making: Summary:  Patient presents emergency department right flank pain.  Appears uncomfortable on arrival.  Has had similar pain with ureteral stone in the past. Plan for labs, UA, and CT renal.   Reevaluation with update and discussion with patient.  He is feeling better after pain management in the ED.  He is called for a ride home.  Discussed CT results and ureteral stone.  Gave contact information for urology along with pain medication.  No clear indication for emergent urology consultation or kidney stone mgmt.   Considered admission but pain well controlled and no evidence of infection.   Patient's presentation is most consistent with acute presentation with potential threat to life or bodily function.   Disposition: discharge  ____________________________________________  FINAL CLINICAL IMPRESSION(S) / ED DIAGNOSES  Final diagnoses:  Right ureteral stone  NEW OUTPATIENT MEDICATIONS STARTED DURING THIS VISIT:  Discharge Medication List as  of 04/05/2023 12:43 AM     START taking these medications   Details  ondansetron (ZOFRAN-ODT) 4 MG disintegrating tablet Take 1 tablet (4 mg total) by mouth every 8 (eight) hours as needed., Starting Thu 04/05/2023, Normal    oxyCODONE-acetaminophen (PERCOCET/ROXICET) 5-325 MG tablet Take 1 tablet by mouth every 6 (six) hours as needed for severe pain., Starting Thu 04/05/2023, Normal    senna-docusate (SENOKOT-S) 8.6-50 MG tablet Take 1 tablet by mouth at bedtime as needed for mild constipation., Starting Thu 04/05/2023, Normal        Note:  This document was prepared using Dragon voice recognition software and may include unintentional dictation errors.  Alona Bene, MD, [facility]    Zakee Deerman, Arlyss Repress, MD 04/05/23 [id number]

## 2023-04-05 DIAGNOSIS — N132 Hydronephrosis with renal and ureteral calculous obstruction: Secondary | ICD-10-CM | POA: Diagnosis not present

## 2023-04-05 MED ORDER — SENNOSIDES-DOCUSATE SODIUM 8.6-50 MG PO TABS: 1.0000 | ORAL_TABLET | Freq: Every evening | ORAL | 0 refills | Status: AC | PRN

## 2023-04-05 MED ORDER — OXYCODONE-ACETAMINOPHEN 5-325 MG PO TABS: 1.0000 | ORAL_TABLET | Freq: Four times a day (QID) | ORAL | 0 refills | Status: AC | PRN

## 2023-04-05 MED ORDER — ONDANSETRON 4 MG PO TBDP: 4.0000 mg | ORAL_TABLET | Freq: Three times a day (TID) | ORAL | 0 refills | Status: AC | PRN

## 2023-04-05 MED ORDER — TAMSULOSIN HCL 0.4 MG PO CAPS: 0.4000 mg | ORAL_CAPSULE | Freq: Every day | ORAL | 0 refills | Status: AC

## 2023-04-05 MED ORDER — HYDROMORPHONE HCL 1 MG/ML IJ SOLN
1.0000 mg | Freq: Once | INTRAMUSCULAR | Status: AC
  Administered 2023-04-05: 1 mg via INTRAVENOUS
  Filled 2023-04-05: qty 1

## 2023-04-05 NOTE — Discharge Instructions (Signed)
You have been seen in the Emergency Department (ED) today for pain that we believe based on your workup, is caused by kidney stones.  As we have discussed, please drink plenty of fluids.  Please make a follow up appointment with the physician(s) listed elsewhere in this documentation. ? ?You may take pain medication as needed but ONLY as prescribed.  Please also take your prescribed Flomax daily.  We also recommend that you take over-the-counter ibuprofen regularly according to label instructions over the next 5 days.  Take it with meals to minimize stomach discomfort. ? ?Please see your doctor as soon as possible as stones may take 1-3 weeks to pass and you may require additional care or medications. ? ?Do not drink alcohol, drive or participate in any other potentially dangerous activities while taking opiate pain medication as it may make you sleepy. Do not take this medication with any other sedating medications, either prescription or over-the-counter. If you were prescribed Percocet or Vicodin, do not take these with acetaminophen (Tylenol) as it is already contained within these medications. ?  ?Take Percocet as needed for severe pain.  This medication is an opiate (or narcotic) pain medication and can be habit forming.  Use it as little as possible to achieve adequate pain control.  Do not use or use it with extreme caution if you have a history of opiate abuse or dependence.  If you are on a pain contract with your primary care doctor or a pain specialist, be sure to let them know you were prescribed this medication today from the Emergency Department.  This medication is intended for your use only - do not give any to anyone else and keep it in a secure place where nobody else, especially children, have access to it.  It will also cause or worsen constipation, so you may want to consider taking an over-the-counter stool softener while you are taking this medication. ? ?Return to the Emergency Department  (ED) or call your doctor if you have any worsening pain, fever, painful urination, are unable to urinate, or develop other symptoms that concern you. ? ? ? ? ? ? ?
